# Patient Record
Sex: Female | Born: 1995 | Race: Black or African American | Hispanic: No | Marital: Single | Smoking: Never smoker
Health system: Southern US, Community
[De-identification: ages and names within clinical notes are randomized; demographics above are authoritative.]

## PROBLEM LIST (undated history)

## (undated) DIAGNOSIS — Z789 Other specified health status: Secondary | ICD-10-CM

## (undated) HISTORY — PX: NO PAST SURGERIES: SHX2092

---

## 2018-07-11 ENCOUNTER — Encounter (HOSPITAL_COMMUNITY): Payer: Self-pay | Admitting: Emergency Medicine

## 2018-07-11 ENCOUNTER — Emergency Department (HOSPITAL_COMMUNITY)
Admission: EM | Admit: 2018-07-11 | Discharge: 2018-07-11 | Disposition: A | Discharge status: Home / Self Care | Payer: 59 | Attending: Emergency Medicine | Admitting: Emergency Medicine

## 2018-07-11 ENCOUNTER — Other Ambulatory Visit: Payer: Self-pay

## 2018-07-11 DIAGNOSIS — L509 Urticaria, unspecified: Secondary | ICD-10-CM | POA: Insufficient documentation

## 2018-07-11 NOTE — ED Provider Notes (Signed)
[facility] EMERGENCY DEPARTMENT Provider Note   CSN: [id number] Arrival date & time: 07/11/18  1817     History   Chief Complaint Chief Complaint  Patient presents with  . Allergic Reaction    HPI Kelly Cortez is a 22 y.o. female.  22 y.o female with no PMH presents to the ED with a chief complaint of hives x 2 days. Patient reports she first noted the hives around her arms, torso and face.She describes them as pruritic.  She has taken benadryl and the hives and itching has improved. She denies any anaphalaxis, shortness of breath or history of asthma.      History reviewed. No pertinent past medical history.  There are no active problems to display for this patient.   History reviewed. No pertinent surgical history.   OB History   None      Home Medications    Prior to Admission medications   Not on File    Family History No family history on file.  Social History Social History   Tobacco Use  . Smoking status: Never Smoker  . Smokeless tobacco: Never Used  Substance Use Topics  . Alcohol use: Not Currently  . Drug use: Not Currently     Allergies   Patient has no allergy information on record.   Review of Systems Review of Systems  Skin: Positive for rash.  All other systems reviewed and are negative.    Physical Exam Updated Vital Signs BP 122/81 (BP Location: Right Arm)   Pulse 83   Temp 98.8 F (37.1 C) (Oral)   Resp 14   Ht 5\' 2"  (1.575 m)   Wt 66.2 kg   LMP 06/17/2018   SpO2 100%   BMI 26.70 kg/m   Physical Exam  Constitutional: She is oriented to person, place, and time. She appears well-developed and well-nourished.  HENT:  Head: Normocephalic and atraumatic.  Neck: Normal range of motion. Neck supple.  Cardiovascular: Normal heart sounds.  Pulmonary/Chest: Effort normal.  Abdominal: Soft. There is no tenderness.  Musculoskeletal: She exhibits no tenderness.  Neurological: She is alert and  oriented to person, place, and time.  Skin: Skin is warm and dry. Rash noted. Rash is urticarial.     Small amount of erythema wheals noted on left forearm and right forearm. Patient reports she took benadryl an hour ago.   Nursing note and vitals reviewed.    ED Treatments / Results  Labs (all labs ordered are listed, but only abnormal results are displayed) Labs Reviewed - No data to display  EKG None  Radiology No results found.  Procedures Procedures (including critical care time)  Medications Ordered in ED Medications - No data to display   Initial Impression / Assessment and Plan / ED Course  I have reviewed the triage vital signs and the nursing notes.  Pertinent labs & imaging results that were available during my care of the patient were reviewed by me and considered in my medical decision making (see chart for details).    Presents with rash which began on her left forearm, she reports the rash noted on her torso along her back.  Patient has taken Benadryl and states that the hives have improved significantly.  There is no mucosal involvement or anaphylaxis.  Patient reports she is been eating and drinking the same thing that she usually does ruffles, cookies. Patient reports improvement with Benadryl hives looked improved slight erythema noted. Patient denies any fever or mucosal  involvement. I have ice patient she may continue to take Benadryl but can also switch to Zyrtec that this is nondrowsy, she should also keep a food log of what she is eating in order to determine what is causing this rash.  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  Patient will be discharged with recommendations to obtain some Zyrtec's and follow-up with PCP as needed.  Return precautions provided.  Final Clinical Impressions(s) / ED Diagnoses   Final diagnoses:  Urticaria    ED Discharge Orders    None         Claude Manges, PA-C 07/11/18 1941    Gerhard Munch, MD 07/12/18 [phone]

## 2018-07-11 NOTE — Discharge Instructions (Addendum)
Please continue to take benadryl for your hives if it shows improvement. You may also take Zyrtec which is non-drowsy and will also help with your symptoms.

## 2018-07-11 NOTE — ED Triage Notes (Signed)
C/o allergic reaction with hives and itching all over since Friday.  No known allergies.  Pt eating potato chips during triage exam.  NAD.

## 2018-10-06 NOTE — L&D Delivery Note (Signed)
OB/GYN Faculty Practice Delivery Note  Kelly Cortez is a 23 y.o. G1P0 s/p SVD at [redacted]w[redacted]d. She was admitted for SOL.   ROM: 6h 47m with clear fluid GBS Status: Negative/-- (09/01 0858) Maximum Maternal Temperature: 98.2F  Labor Progress: . Patient presented to L&D for SOL. Initial SVE: 1/60/-2. Patient received Cytotec, Foley bulb and AROM. She then progressed to complete. Patient declined epidural.    Delivery Date/Time: 9/28 @ 0301 Delivery: Called to room and patient was complete and pushing. Head delivered in LOA position. Nuchal cord present and reduced after delivery. Shoulder and body delivered in usual fashion. Infant with spontaneous cry, placed on mother's abdomen, dried and stimulated. Cord clamped x 2 after 1-minute delay, and cut by FOB. Cord blood drawn. Placenta delivered spontaneously with gentle cord traction. Fundus firm with massage and Pitocin. Labia, perineum, vagina, and cervix inspected inspected with third degree perineal laceration (3a) which was repaired with 2-0 Vicryl in a standard fashion by Dr. Rosana Hoes. Ancef 2 g given after perineal laceration repair.  Baby Weight: pending   Placenta: Sent with patient Complications: None Lacerations: Third degree perineal laceration (3a) EBL: 175 mL Analgesia: None (Lidocaine and Fentanyl for repair)  Infant:  APGAR (1 MIN): 7  APGAR (5 MINS): 9 APGAR (10 MINS):    Barrington Ellison, MD [facility] Family Medicine Fellow, [facility] for [facility] [place] [facility] 07/04/2019, 3:57 AM

## 2018-12-23 LAB — OB RESULTS CONSOLE ABO/RH: RH Type: POSITIVE

## 2018-12-23 LAB — OB RESULTS CONSOLE RPR: RPR: NONREACTIVE

## 2018-12-23 LAB — OB RESULTS CONSOLE HIV ANTIBODY (ROUTINE TESTING): HIV: NONREACTIVE

## 2018-12-23 LAB — OB RESULTS CONSOLE HEPATITIS B SURFACE ANTIGEN: Hepatitis B Surface Ag: NEGATIVE

## 2018-12-23 LAB — OB RESULTS CONSOLE RUBELLA ANTIBODY, IGM: Rubella: IMMUNE

## 2019-01-30 ENCOUNTER — Other Ambulatory Visit: Payer: Self-pay

## 2019-01-30 ENCOUNTER — Encounter (HOSPITAL_COMMUNITY): Payer: Self-pay | Admitting: *Deleted

## 2019-01-30 ENCOUNTER — Inpatient Hospital Stay (HOSPITAL_COMMUNITY)
Admission: AD | Admit: 2019-01-30 | Discharge: 2019-01-30 | Disposition: A | Discharge status: Home / Self Care | Payer: 59 | Attending: Obstetrics & Gynecology | Admitting: Obstetrics & Gynecology

## 2019-01-30 DIAGNOSIS — Z3A18 18 weeks gestation of pregnancy: Secondary | ICD-10-CM | POA: Diagnosis not present

## 2019-01-30 DIAGNOSIS — R102 Pelvic and perineal pain: Secondary | ICD-10-CM | POA: Insufficient documentation

## 2019-01-30 DIAGNOSIS — Z3492 Encounter for supervision of normal pregnancy, unspecified, second trimester: Secondary | ICD-10-CM | POA: Diagnosis not present

## 2019-01-30 DIAGNOSIS — R1011 Right upper quadrant pain: Secondary | ICD-10-CM | POA: Diagnosis not present

## 2019-01-30 DIAGNOSIS — R109 Unspecified abdominal pain: Secondary | ICD-10-CM | POA: Diagnosis not present

## 2019-01-30 DIAGNOSIS — O9989 Other specified diseases and conditions complicating pregnancy, childbirth and the puerperium: Secondary | ICD-10-CM | POA: Insufficient documentation

## 2019-01-30 DIAGNOSIS — O26892 Other specified pregnancy related conditions, second trimester: Secondary | ICD-10-CM

## 2019-01-30 DIAGNOSIS — O26899 Other specified pregnancy related conditions, unspecified trimester: Secondary | ICD-10-CM

## 2019-01-30 HISTORY — DX: Other specified health status: Z78.9

## 2019-01-30 LAB — POCT PREGNANCY, URINE: Preg Test, Ur: POSITIVE — AB

## 2019-01-30 LAB — URINALYSIS, ROUTINE W REFLEX MICROSCOPIC
Bilirubin Urine: NEGATIVE
Glucose, UA: NEGATIVE mg/dL
Hgb urine dipstick: NEGATIVE
Ketones, ur: NEGATIVE mg/dL
Leukocytes,Ua: NEGATIVE
Nitrite: NEGATIVE
Protein, ur: NEGATIVE mg/dL
Specific Gravity, Urine: 1.021 (ref 1.005–1.030)
pH: 6 (ref 5.0–8.0)

## 2019-01-30 LAB — WET PREP, GENITAL
Clue Cells Wet Prep HPF POC: NONE SEEN
Sperm: NONE SEEN
Trich, Wet Prep: NONE SEEN
Yeast Wet Prep HPF POC: NONE SEEN

## 2019-01-30 NOTE — Discharge Instructions (Signed)

## 2019-01-30 NOTE — MAU Note (Addendum)
Kelly Cortez is a 23 y.o. at [redacted]w[redacted]d here in MAU reporting: upper and lower abdominal pain bilaterally. Intermittent Cramping and sharp in nature. NO PNC. Would like a list of providers to research. Onset of complaint: 4-5 days Pain score: 5/10 Denies LOF, VB, or abnormal discharge. Vitals:   01/30/19 1347  BP: 108/62  Pulse: 84  Resp: 17  Temp: 97.8 F (36.6 C)  SpO2: 99%     FHT: 145 via doppler

## 2019-01-30 NOTE — MAU Provider Note (Signed)
History     CSN: [id number]  Arrival date and time: 01/30/19 1311   First Provider Initiated Contact with Patient 01/30/19 1352      Chief Complaint  Patient presents with  . Abdominal Pain   HPI Kelly Cortez is a 23 y.o. G1P0 at [redacted]w[redacted]d who presents to MAU with chief complaint of RUQ pain and suprapubic cramping. These are new problems, onset 4-5 days ago. She rates her pain as 6/10, it waxes and wanes. Her pain does not radiate and she denies aggravating or alleviating factors. She has not taken medication or tried other treatments for this pain. Patient states she became worried when she realized her pain had been present for almost one week and decided to seek care. She denies pain upon arrival and throughout her evaluation in MAU. She declines pain medicine.  Patient's history includes a new ob appointment in [place] before she moved to [place] last month. She states her appointment included an ultrasound and she was told everything was great except she was diagnosed with Bacterial Vaginosis.  She denies vaginal bleeding, leaking of fluid, decreased fetal movement, fever, falls, or recent illness.  She denies nausea/vomiting, urinary complaints, history of UTIs or kidney stones.  She lives with her boyfriend/FOB. She denies SI, HI or IPV.   OB History    Gravida  1   Para      Term      Preterm      AB      Living        SAB      TAB      Ectopic      Multiple      Live Births              Past Medical History:  Diagnosis Date  . Medical history non-contributory     Past Surgical History:  Procedure Laterality Date  . NO PAST SURGERIES      History reviewed. No pertinent family history.  Social History   Tobacco Use  . Smoking status: Never Smoker  . Smokeless tobacco: Never Used  Substance Use Topics  . Alcohol use: Not Currently  . Drug use: Not Currently    Allergies: No Known Allergies  No medications prior to admission.     Review of Systems  Constitutional: Negative for chills, fatigue and fever.  Respiratory: Negative for shortness of breath.   Gastrointestinal: Positive for abdominal pain.  Genitourinary: Negative for difficulty urinating, dysuria, flank pain, pelvic pain, vaginal bleeding, vaginal discharge and vaginal pain.  Musculoskeletal: Negative for back pain.  Neurological: Negative for headaches.  All other systems reviewed and are negative.  Physical Exam   Blood pressure 108/62, pulse 84, temperature 97.8 F (36.6 C), temperature source Oral, resp. rate 17, last menstrual period 06/17/2018, SpO2 99 %.  Physical Exam  Nursing note and vitals reviewed. Constitutional: She is oriented to person, place, and time. She appears well-developed and well-nourished.  Cardiovascular: Normal rate.  Respiratory: Effort normal.  GI: Soft. She exhibits no distension. There is no abdominal tenderness. There is no rebound and no guarding.  Musculoskeletal: Normal range of motion.  Neurological: She is alert and oriented to person, place, and time.  Skin: Skin is warm.  Psychiatric: She has a normal mood and affect. Her behavior is normal. Judgment and thought content normal.    MAU Course/MDM  Procedures  Patient Vitals for the past 24 hrs:  BP Temp Temp src Pulse Resp SpO2  01/30/19  1501 108/69 - - 72 17 99 %  01/30/19 1347 108/62 97.8 F (36.6 C) Oral 84 17 99 %    Results for orders placed or performed during the hospital encounter of 01/30/19 (from the past 24 hour(s))  Pregnancy, urine POC     Status: Abnormal   Collection Time: 01/30/19  1:36 PM  Result Value Ref Range   Preg Test, Ur POSITIVE (A) NEGATIVE  Urinalysis, Routine w reflex microscopic     Status: None   Collection Time: 01/30/19  2:05 PM  Result Value Ref Range   Color, Urine YELLOW YELLOW   APPearance CLEAR CLEAR   Specific Gravity, Urine 1.021 1.005 - 1.030   pH 6.0 5.0 - 8.0   Glucose, UA NEGATIVE NEGATIVE mg/dL    Hgb urine dipstick NEGATIVE NEGATIVE   Bilirubin Urine NEGATIVE NEGATIVE   Ketones, ur NEGATIVE NEGATIVE mg/dL   Protein, ur NEGATIVE NEGATIVE mg/dL   Nitrite NEGATIVE NEGATIVE   Leukocytes,Ua NEGATIVE NEGATIVE  Wet prep, genital     Status: Abnormal   Collection Time: 01/30/19  2:37 PM  Result Value Ref Range   Yeast Wet Prep HPF POC NONE SEEN NONE SEEN   Trich, Wet Prep NONE SEEN NONE SEEN   Clue Cells Wet Prep HPF POC NONE SEEN NONE SEEN   WBC, Wet Prep HPF POC RARE (A) NONE SEEN   Sperm NONE SEEN      Assessment and Plan  --23 y.o. G1P0 at 4347w3d  --FHT 145 by Doppler --No concerning findings on physical exam or today's labs --Discharge home in stable condition  F/U: Patient to establish OB care at her earliest convenience. Given list of Providers in [place]  Maralyn Witherell C [place], [place] 01/30/2019, 3:20 PM

## 2019-01-31 LAB — GC/CHLAMYDIA PROBE AMP (~~LOC~~) NOT AT ARMC
Chlamydia: NEGATIVE
Neisseria Gonorrhea: NEGATIVE

## 2019-03-09 ENCOUNTER — Other Ambulatory Visit: Payer: Self-pay

## 2019-03-09 ENCOUNTER — Inpatient Hospital Stay (HOSPITAL_COMMUNITY)
Admission: AD | Admit: 2019-03-09 | Discharge: 2019-03-09 | Disposition: A | Discharge status: Home / Self Care | Payer: Medicaid Other | Attending: Obstetrics and Gynecology | Admitting: Obstetrics and Gynecology

## 2019-03-09 DIAGNOSIS — O26892 Other specified pregnancy related conditions, second trimester: Secondary | ICD-10-CM | POA: Diagnosis present

## 2019-03-09 DIAGNOSIS — Z3A23 23 weeks gestation of pregnancy: Secondary | ICD-10-CM | POA: Diagnosis not present

## 2019-03-09 NOTE — MAU Note (Signed)
Pt states she was seen here for abdm pain approx 70mo ago & was told to come back if she couldn't find an OB/GYN.  States +FM, denies LOF, pain, or vag bleeding.

## 2019-03-09 NOTE — Discharge Instructions (Signed)

## 2019-03-09 NOTE — MAU Provider Note (Signed)
  History     CSN: [id number]  Arrival date and time: 03/09/19 1433   First Provider Initiated Contact with Patient 03/09/19 1516      Chief Complaint  Patient presents with  . Follow-up   Kelly Cortez is a 23 y.o. G1P0 at [redacted]w[redacted]d who presents today for follow up. She was seen in at the beginning of April, and was told if she can't get any appointment anywhere she could return here. She has an appointment for a new OB visit on 03/28/2019, but she was concerned that was too far away. She was getting prenatal care in [place] prior to coming here. She states that she has already had several US done at her prior OB office, but she does not have records. Her prior OB office told her they could fax records when needed. She denies any pain, bleeding or LOF. She reports normal fetal movement.    OB History    Gravida  1   Para      Term      Preterm      AB      Living        SAB      TAB      Ectopic      Multiple      Live Births              Past Medical History:  Diagnosis Date  . Medical history non-contributory     Past Surgical History:  Procedure Laterality Date  . NO PAST SURGERIES      No family history on file.  Social History   Tobacco Use  . Smoking status: Never Smoker  . Smokeless tobacco: Never Used  Substance Use Topics  . Alcohol use: Not Currently  . Drug use: Not Currently    Allergies: No Known Allergies  No medications prior to admission.    Review of Systems  All other systems reviewed and are negative.  Physical Exam   Blood pressure 110/69, pulse 88, temperature 98.5 F (36.9 C), temperature source Oral, resp. rate 16, height 5\' 1"  (1.549 m), weight 68.9 kg, last menstrual period 06/17/2018, SpO2 98 %.  Physical Exam  Nursing note and vitals reviewed. Constitutional: She is oriented to person, place, and time. She appears well-developed and well-nourished. No distress.  HENT:  Head: Normocephalic.  Cardiovascular: Normal  rate.  Respiratory: Effort normal.  Musculoskeletal: Normal range of motion.  Neurological: She is alert and oriented to person, place, and time.  Psychiatric: She has a normal mood and affect.   FHT 165 with doppler  MAU Course  Procedures  MDM   Assessment and Plan   1. [redacted] weeks gestation of pregnancy    DC home Comfort measures reviewed  2nd Trimester precautions  PTL precautions  Fetal kick counts RX: none  Return to MAU as needed FU with OB as planned Patient advised to come to [facility] appointment fasting so that they can do her GDM test on the same day.   Follow-up Information    [facility] Follow up.   Specialty:  Obstetrics and Gynecology Why:  Have your prior doctors office send your records to this office  Contact information: [address] [place] [address] [phone]          Thressa Sheller DNP, CNM  03/09/19  3:30 PM

## 2019-03-28 ENCOUNTER — Encounter: Payer: Self-pay | Admitting: Family Medicine

## 2019-03-28 ENCOUNTER — Ambulatory Visit (INDEPENDENT_AMBULATORY_CARE_PROVIDER_SITE_OTHER): Payer: 59 | Admitting: Family Medicine

## 2019-03-28 ENCOUNTER — Telehealth: Payer: Self-pay

## 2019-03-28 ENCOUNTER — Other Ambulatory Visit: Payer: Self-pay

## 2019-03-28 DIAGNOSIS — Z3402 Encounter for supervision of normal first pregnancy, second trimester: Secondary | ICD-10-CM | POA: Diagnosis not present

## 2019-03-28 DIAGNOSIS — Z3A26 26 weeks gestation of pregnancy: Secondary | ICD-10-CM

## 2019-03-28 DIAGNOSIS — Z3400 Encounter for supervision of normal first pregnancy, unspecified trimester: Secondary | ICD-10-CM

## 2019-03-28 MED ORDER — AMBULATORY NON FORMULARY MEDICATION: 1.0000 | 0 refills | Status: AC

## 2019-03-28 NOTE — Telephone Encounter (Signed)
Mary from [place] called stating pt will return tomorrow 03/29/19 to do 2 hr GTT.  chiquita l wilson, CMA

## 2019-03-28 NOTE — Progress Notes (Signed)
  Subjective:  Kelly Cortez is a G1P0 [redacted]w[redacted]d being seen today for her first obstetrical visit.  She had received care in [place], but moved here in March and hasn't had care since. No complications thus far in the pregnancy. FOB involved. Patient does intend to breast feed. Pregnancy history fully reviewed.  Patient reports no complaints.  BP 105/60   Pulse 89   Wt 154 lb 0.6 oz (69.9 kg)   LMP 06/17/2018   BMI 29.11 kg/m   HISTORY: OB History  Gravida Para Term Preterm AB Living  1            SAB TAB Ectopic Multiple Live Births               # Outcome Date GA Lbr Len/2nd Weight Sex Delivery Anes PTL Lv  1 Current             Past Medical History:  Diagnosis Date  . Medical history non-contributory     Past Surgical History:  Procedure Laterality Date  . NO PAST SURGERIES      No family history on file.   Exam  BP 105/60   Pulse 89   Wt 154 lb 0.6 oz (69.9 kg)   LMP 06/17/2018   BMI 29.11 kg/m   CONSTITUTIONAL: Well-developed, well-nourished female in no acute distress.  HENT:  Normocephalic, atraumatic, External right and left ear normal. Oropharynx is clear and moist EYES: Conjunctivae and EOM are normal. Pupils are equal, round, and reactive to light. No scleral icterus.  NECK: Normal range of motion, supple, no masses.  Normal thyroid.  CARDIOVASCULAR: Normal heart rate noted, regular rhythm RESPIRATORY: Clear to auscultation bilaterally. Effort and breath sounds normal, no problems with respiration noted. BREASTS: Symmetric in size. No masses, skin changes, nipple drainage, or lymphadenopathy. ABDOMEN: Soft, normal bowel sounds, no distention noted.  No tenderness, rebound or guarding.  PELVIC: Normal appearing external genitalia; normal appearing vaginal mucosa and cervix. No abnormal discharge noted. Normal uterine size, no other palpable masses, no uterine or adnexal tenderness. MUSCULOSKELETAL: Normal range of motion. No tenderness.  No cyanosis,  clubbing, or edema.  2+ distal pulses. SKIN: Skin is warm and dry. No rash noted. Not diaphoretic. No erythema. No pallor. NEUROLOGIC: Alert and oriented to person, place, and time. Normal reflexes, muscle tone coordination. No cranial nerve deficit noted. PSYCHIATRIC: Normal mood and affect. Normal behavior. Normal judgment and thought content.    Assessment:    Pregnancy: G1P0 Patient Active Problem List   Diagnosis Date Noted  . Supervision of normal first pregnancy, antepartum 03/28/2019      Plan:   1. Supervision of normal first pregnancy, antepartum FHT and FH normal.  Discussed nature of practice. Will get fetal survey and request records. - CBC - Glucose Tolerance, 2 Hours w/1 Hour - HIV Antibody (routine testing w rflx) - RPR - POC Urinalysis Dipstick OB     Problem list reviewed and updated. 75% of 30 min visit spent on counseling and coordination of care.    Truett Mainland 03/28/2019

## 2019-03-31 ENCOUNTER — Telehealth: Payer: Self-pay

## 2019-03-31 MED ORDER — VITAFOL-OB+DHA 65-1 & 250 MG PO MISC: 1.0000 | Freq: Every day | ORAL | 12 refills | Status: AC

## 2019-03-31 NOTE — Telephone Encounter (Signed)
Patient request refill of vitfol prenatals [facility] [address]. Kathrene Alu RN

## 2019-04-01 LAB — CBC
Hematocrit: 32.8 % — ABNORMAL LOW (ref 34.0–46.6)
Hemoglobin: 10.8 g/dL — ABNORMAL LOW (ref 11.1–15.9)
MCH: 28.3 pg (ref 26.6–33.0)
MCHC: 32.9 g/dL (ref 31.5–35.7)
MCV: 86 fL (ref 79–97)
Platelets: 171 10*3/uL (ref 150–450)
RBC: 3.81 x10E6/uL (ref 3.77–5.28)
RDW: 13.7 % (ref 11.7–15.4)
WBC: 9.9 10*3/uL (ref 3.4–10.8)

## 2019-04-01 LAB — HIV ANTIBODY (ROUTINE TESTING W REFLEX): HIV Screen 4th Generation wRfx: NONREACTIVE

## 2019-04-01 LAB — GLUCOSE TOLERANCE, 2 HOURS W/ 1HR
Glucose, 1 hour: 56 mg/dL — ABNORMAL LOW (ref 65–179)
Glucose, 2 hour: 82 mg/dL (ref 65–152)
Glucose, Fasting: 68 mg/dL (ref 65–91)

## 2019-04-01 LAB — RPR: RPR Ser Ql: NONREACTIVE

## 2019-04-04 ENCOUNTER — Other Ambulatory Visit: Payer: Self-pay

## 2019-04-04 MED ORDER — FERROUS GLUCONATE 324 (38 FE) MG PO TABS: 324.0000 mg | ORAL_TABLET | Freq: Two times a day (BID) | ORAL | 3 refills | Status: DC

## 2019-04-04 NOTE — Telephone Encounter (Signed)
Patient made aware of low iron levels. Would like for Rx to be sent to different pharmacy. Changed in the chart. Kathrene Alu RN

## 2019-04-04 NOTE — Addendum Note (Signed)
Addended by: Truett Mainland on: 04/04/2019 09:09 AM   Modules accepted: Orders

## 2019-04-06 ENCOUNTER — Telehealth: Payer: Self-pay

## 2019-04-06 NOTE — Telephone Encounter (Signed)
Error

## 2019-04-07 ENCOUNTER — Telehealth: Payer: Self-pay

## 2019-04-07 NOTE — Telephone Encounter (Signed)
Called and left message on doctors line at [facility] making them aware dr. Nehemiah Settle approves that she take the vitifol prenatal and an addition iron pill due to anemia in pregnancy. Kathrene Alu RN

## 2019-04-12 ENCOUNTER — Encounter: Payer: Self-pay | Admitting: Advanced Practice Midwife

## 2019-04-12 ENCOUNTER — Other Ambulatory Visit: Payer: Self-pay

## 2019-04-12 ENCOUNTER — Ambulatory Visit (INDEPENDENT_AMBULATORY_CARE_PROVIDER_SITE_OTHER): Payer: 59 | Admitting: Advanced Practice Midwife

## 2019-04-12 DIAGNOSIS — Z3400 Encounter for supervision of normal first pregnancy, unspecified trimester: Secondary | ICD-10-CM

## 2019-04-12 DIAGNOSIS — Z3A28 28 weeks gestation of pregnancy: Secondary | ICD-10-CM

## 2019-04-12 DIAGNOSIS — Z3403 Encounter for supervision of normal first pregnancy, third trimester: Secondary | ICD-10-CM

## 2019-04-12 NOTE — Progress Notes (Signed)
   [place] VIRTUAL VIDEO VISIT ENCOUNTER NOTE  Provider location: [facility] [employer] [facility]   I connected with Erskine Emery on 04/12/19 at 10:30 AM EDT by Telephone Encounter at home (patient unable to connect via video) and verified that I am speaking with the correct person using two identifiers.   I discussed the limitations, risks, security and privacy concerns of performing an evaluation and management service virtually and the availability of in person appointments. I also discussed with the patient that there may be a patient responsible charge related to this service. The patient expressed understanding and agreed to proceed. Subjective:  Ellene Bloodsaw is a 23 y.o. G1P0 at [redacted]w[redacted]d being seen today for ongoing prenatal care.  She is currently monitored for the following issues for this low-risk pregnancy and has Supervision of normal first pregnancy, antepartum on their problem list.  Patient reports no complaints.   .  .  Movement: Present. Denies any leaking of fluid.   The following portions of the patient's history were reviewed and updated as appropriate: allergies, current medications, past family history, past medical history, past social history, past surgical history and problem list.   Objective:   Vitals:   04/12/19 1036  BP: 120/79  Pulse: 97    Fetal Status:     Movement: Present     General:  Alert, oriented and cooperative. Patient is in no acute distress.  Respiratory: Normal respiratory effort, no problems with respiration noted  Mental Status: Normal mood and affect. Normal behavior. Normal judgment and thought content.  Rest of physical exam deferred due to type of encounter  Imaging: No results found.  Assessment and Plan:  Pregnancy: G1P0 at [redacted]w[redacted]d 1. Supervision of normal first pregnancy, antepartum      Has appt for anatomy US tomorrow. Reviewed location and how to get there      Reviewed that her  Glucose Tolerance test was normal       Reviewed signs to watch for and how to get to MAU  Preterm labor symptoms and general obstetric precautions including but not limited to vaginal bleeding, contractions, leaking of fluid and fetal movement were reviewed in detail with the patient. I discussed the assessment and treatment plan with the patient. The patient was provided an opportunity to ask questions and all were answered. The patient agreed with the plan and demonstrated an understanding of the instructions. The patient was advised to call back or seek an in-person office evaluation/go to MAU at [facility] for any urgent or concerning symptoms. Please refer to After Visit Summary for other counseling recommendations.   I provided 10 minutes of telephone time during this encounter.  RTO 2 weeks  Future Appointments  Date Time Provider [place]  04/13/2019 12:45 PM WH-MFC [place] 2 WH-MFCUS MFC-US    Shadell Brenn, [place] for [employer], [place]

## 2019-04-12 NOTE — Progress Notes (Signed)
120/79 P 97

## 2019-04-13 ENCOUNTER — Other Ambulatory Visit: Payer: Self-pay

## 2019-04-13 ENCOUNTER — Ambulatory Visit (HOSPITAL_COMMUNITY)
Admission: RE | Admit: 2019-04-13 | Discharge: 2019-04-13 | Disposition: A | Discharge status: Home / Self Care | Payer: 59 | Source: Ambulatory Visit | Attending: Family Medicine | Admitting: Family Medicine

## 2019-04-13 DIAGNOSIS — Z3A28 28 weeks gestation of pregnancy: Secondary | ICD-10-CM

## 2019-04-13 DIAGNOSIS — O0933 Supervision of pregnancy with insufficient antenatal care, third trimester: Secondary | ICD-10-CM

## 2019-04-13 DIAGNOSIS — Z3400 Encounter for supervision of normal first pregnancy, unspecified trimester: Secondary | ICD-10-CM

## 2019-04-13 DIAGNOSIS — Z363 Encounter for antenatal screening for malformations: Secondary | ICD-10-CM

## 2019-04-13 DIAGNOSIS — Z3687 Encounter for antenatal screening for uncertain dates: Secondary | ICD-10-CM

## 2019-04-14 ENCOUNTER — Other Ambulatory Visit (HOSPITAL_COMMUNITY): Payer: Self-pay | Admitting: *Deleted

## 2019-04-14 DIAGNOSIS — Z362 Encounter for other antenatal screening follow-up: Secondary | ICD-10-CM

## 2019-04-28 ENCOUNTER — Other Ambulatory Visit: Payer: Self-pay

## 2019-04-28 ENCOUNTER — Encounter: Payer: 59 | Admitting: Family Medicine

## 2019-05-02 ENCOUNTER — Ambulatory Visit (INDEPENDENT_AMBULATORY_CARE_PROVIDER_SITE_OTHER): Payer: 59 | Admitting: Family Medicine

## 2019-05-02 VITALS — BP 115/74 | HR 118

## 2019-05-02 DIAGNOSIS — Z3A31 31 weeks gestation of pregnancy: Secondary | ICD-10-CM

## 2019-05-02 DIAGNOSIS — Z3403 Encounter for supervision of normal first pregnancy, third trimester: Secondary | ICD-10-CM

## 2019-05-02 DIAGNOSIS — Z3400 Encounter for supervision of normal first pregnancy, unspecified trimester: Secondary | ICD-10-CM

## 2019-05-02 NOTE — Progress Notes (Signed)
[place] VIRTUAL VIDEO VISIT ENCOUNTER NOTE  Provider location: [facility] [place] at [facility]   I connected with Kelly Cortez on 05/02/19 at 11:00 AM EDT by WebEx Video Encounter at home and verified that I am speaking with the correct person using two identifiers.   I discussed the limitations, risks, security and privacy concerns of performing an evaluation and management service virtually and the availability of in person appointments. I also discussed with the patient that there may be a patient responsible charge related to this service. The patient expressed understanding and agreed to proceed. Subjective:  Kelly Cortez is a 23 y.o. G1P0 at [redacted]w[redacted]d being seen today for ongoing prenatal care.  She is currently monitored for the following issues for this low-risk pregnancy and has Supervision of normal first pregnancy, antepartum on their problem list.  Patient reports no complaints.  Contractions: Not present. Vag. Bleeding: None.  Movement: Present. Denies any leaking of fluid.   The following portions of the patient's history were reviewed and updated as appropriate: allergies, current medications, past family history, past medical history, past social history, past surgical history and problem list.   Objective:   Vitals:   05/02/19 0949  BP: 115/74  Pulse: (!) 118    Fetal Status:     Movement: Present     General:  Alert, oriented and cooperative. Patient is in no acute distress.  Respiratory: Normal respiratory effort, no problems with respiration noted  Mental Status: Normal mood and affect. Normal behavior. Normal judgment and thought content.  Rest of physical exam deferred due to type of encounter  Imaging: [place] Mfm Ob Comp + 14 Wk  Result Date: 04/13/2019 ----------------------------------------------------------------------  OBSTETRICS REPORT                       (Signed Final 04/13/2019 05:02 pm)  ---------------------------------------------------------------------- Patient Info  ID #:       [id number]                          D.O.B.:  Sep 05, 1996 (23 yrs)  Name:       Kelly Cortez                  Visit Date: 04/13/2019 02:12 pm ---------------------------------------------------------------------- Performed By  Performed By:     Valda Favia,         Ref. Address:     [place]  Attending:        Sander Nephew      [facility]                                       [facility]  [facility] ---------------------------------------------------------------------- Orders   #  Description                          Code         Ordered By   1  US MFM OB COMP + 14 WK               [id number]     JACOB STINSON  ----------------------------------------------------------------------   #  Order #                    Accession #                 Episode #   1  [id number]                  [id number]                  [id number]  ---------------------------------------------------------------------- Indications   Antenatal screening for malformations          Z36.3   Encounter for uncertain dates                  Z36.87   Insufficient Prenatal Care                     O09.30   [redacted] weeks gestation of pregnancy                Z3A.28  ---------------------------------------------------------------------- Fetal Evaluation  Num Of Fetuses:         1  Fetal Heart Rate(bpm):  142  Cardiac Activity:       Observed  Presentation:           Cephalic  Placenta:               Posterior  P. Cord Insertion:      Visualized  Amniotic Fluid  AFI FV:      Within normal limits  AFI Sum(cm)     %Tile       Largest Pocket(cm)  11.34           22          3.18  RUQ(cm)       RLQ(cm)       LUQ(cm)        LLQ(cm)  2.91          2.81          2.44           3.18  ---------------------------------------------------------------------- Biometry  BPD:      70.5  mm     G. Age:  28w 2d         39  %    CI:        73.25   %    70 - 86                                                          FL/HC:      20.4   %    18.8 - 20.6  HC:      261.8  mm     G. Age:  28w 3d         24  %  HC/AC:      1.09        1.05 - 1.21  AC:      239.3  mm     G. Age:  28w 2d         40  %    FL/BPD:     75.7   %    71 - 87  FL:       53.4  mm     G. Age:  28w 2d         36  %    FL/AC:      22.3   %    20 - 24  HUM:      47.8  mm     G. Age:  28w 0d         41  %  Est. FW:    1204  gm    2 lb 10 oz      37  % ---------------------------------------------------------------------- OB History  Gravidity:    1         Term:   0        Prem:   0        SAB:   0  TOP:          0       Ectopic:  0        Living: 0 ---------------------------------------------------------------------- Gestational Age  U/S Today:     28w 2d                                        EDD:   07/04/19  Best:          28w 2d     Det. By:  U/S (04/13/19)           EDD:   07/04/19 ---------------------------------------------------------------------- Anatomy  Cranium:               Appears normal         LVOT:                   Appears normal  Cavum:                 Appears normal         Aortic Arch:            Appears normal  Ventricles:            Appears normal         Diaphragm:              Appears normal  Choroid Plexus:        Appears normal         Stomach:                Appears normal, left                                                                        sided  Cerebellum:            Appears normal         Abdomen:  Appears normal  Posterior Fossa:       Appears normal         Abdominal Wall:         Appears nml (cord                                                                        insert, abd wall)  Nuchal Fold:           Not applicable (>20    Cord Vessels:           Appears normal ([redacted]                          wks GA)                                        vessel cord)  Face:                  Appears normal         Kidneys:                Appear normal                         (orbits and profile)  Lips:                  Appears normal         Bladder:                Appears normal  Thoracic:              Appears normal         Spine:                  Appears normal  Heart:                 Appears normal         Upper Extremities:      Appears normal                         (4CH, axis, and                         situs)  RVOT:                  Appears normal         Lower Extremities:      Appears normal  Other:  Hands and feet visualized. 3VV and 3VT visualized. ---------------------------------------------------------------------- Cervix Uterus Adnexa  Cervix  Normal appearance by transabdominal scan.  Uterus  No abnormality visualized.  Left Ovary  Within normal limits.  Right Ovary  Within normal limits.  Cul De Sac  No free fluid seen.  Adnexa  No abnormality visualized. ---------------------------------------------------------------------- Impression  Uncertain LMP- The pregnancy is dated by today's  examination.  Normal interval growth.  No ultrasonic evidence of structural  fetal anomalies. ---------------------------------------------------------------------- Recommendations  Follow up growth in 4-6 weeks to trend growth and  confirm  dating. ----------------------------------------------------------------------               Lin Landsmanorenthian Booker, MD Electronically Signed Final Report   04/13/2019 05:02 pm ----------------------------------------------------------------------   Assessment and Plan:  Pregnancy: G1P0 at 4076w5d  1. Supervision of normal first pregnancy, antepartum Doing well, no concerns. Occasional BH ctxs. Good fetal movement. F/u in office for 36 week cultures.   Preterm labor symptoms and general obstetric precautions including but not limited to vaginal bleeding, contractions,  leaking of fluid and fetal movement were reviewed in detail with the patient. I discussed the assessment and treatment plan with the patient. The patient was provided an opportunity to ask questions and all were answered. The patient agreed with the plan and demonstrated an understanding of the instructions. The patient was advised to call back or seek an in-person office evaluation/[facility] for any urgent or concerning symptoms. Please refer to After Visit Summary for other counseling recommendations.   I provided 11 minutes of face-to-face time during this encounter.  Return in about 4 weeks (around 05/30/2019) for OB f/u, In Office.  Future Appointments  Date Time Provider Department Center  05/02/2019 11:00 AM Levie HeritageStinson, Jacob J, DO [facility] None  05/09/2019  7:45 AM [facility] US 2 [facility]US    Levie HeritageJacob J Stinson, DO [facility], [facility]

## 2019-05-02 NOTE — Progress Notes (Signed)
Patient agrees to webex visit today. Kathrene Alu RN

## 2019-05-09 ENCOUNTER — Ambulatory Visit (HOSPITAL_COMMUNITY)
Admission: RE | Admit: 2019-05-09 | Discharge: 2019-05-09 | Disposition: A | Discharge status: Home / Self Care | Payer: 59 | Source: Ambulatory Visit | Attending: Obstetrics and Gynecology | Admitting: Obstetrics and Gynecology

## 2019-05-09 ENCOUNTER — Other Ambulatory Visit: Payer: Self-pay

## 2019-05-09 DIAGNOSIS — Z3A32 32 weeks gestation of pregnancy: Secondary | ICD-10-CM

## 2019-05-09 DIAGNOSIS — Z362 Encounter for other antenatal screening follow-up: Secondary | ICD-10-CM | POA: Diagnosis present

## 2019-05-18 ENCOUNTER — Other Ambulatory Visit: Payer: Self-pay

## 2019-05-18 ENCOUNTER — Emergency Department (HOSPITAL_BASED_OUTPATIENT_CLINIC_OR_DEPARTMENT_OTHER)
Admission: EM | Admit: 2019-05-18 | Discharge: 2019-05-18 | Disposition: A | Discharge status: Home / Self Care | Payer: 59 | Attending: Emergency Medicine | Admitting: Emergency Medicine

## 2019-05-18 ENCOUNTER — Encounter (HOSPITAL_BASED_OUTPATIENT_CLINIC_OR_DEPARTMENT_OTHER): Payer: Self-pay | Admitting: Emergency Medicine

## 2019-05-18 DIAGNOSIS — R1030 Lower abdominal pain, unspecified: Secondary | ICD-10-CM | POA: Diagnosis not present

## 2019-05-18 DIAGNOSIS — O9989 Other specified diseases and conditions complicating pregnancy, childbirth and the puerperium: Secondary | ICD-10-CM | POA: Diagnosis not present

## 2019-05-18 DIAGNOSIS — Z79899 Other long term (current) drug therapy: Secondary | ICD-10-CM | POA: Insufficient documentation

## 2019-05-18 DIAGNOSIS — O26893 Other specified pregnancy related conditions, third trimester: Secondary | ICD-10-CM

## 2019-05-18 DIAGNOSIS — Z3A33 33 weeks gestation of pregnancy: Secondary | ICD-10-CM | POA: Insufficient documentation

## 2019-05-18 DIAGNOSIS — R109 Unspecified abdominal pain: Secondary | ICD-10-CM

## 2019-05-18 LAB — URINALYSIS, ROUTINE W REFLEX MICROSCOPIC
Bilirubin Urine: NEGATIVE
Glucose, UA: NEGATIVE mg/dL
Hgb urine dipstick: NEGATIVE
Ketones, ur: NEGATIVE mg/dL
Leukocytes,Ua: NEGATIVE
Nitrite: NEGATIVE
Protein, ur: NEGATIVE mg/dL
Specific Gravity, Urine: 1.01 (ref 1.005–1.030)
pH: 6.5 (ref 5.0–8.0)

## 2019-05-18 MED ORDER — SODIUM CHLORIDE 0.9 % IV BOLUS (SEPSIS)
1000.0000 mL | Freq: Once | INTRAVENOUS | Status: AC
  Administered 2019-05-18: 1000 mL via INTRAVENOUS

## 2019-05-18 NOTE — Discharge Instructions (Addendum)
You may take Tylenol 1000 mg every 6 hours as needed for pain.  I recommend you increase your water intake at home and try to drink at least 60 ounces of water every day.  Please follow-up with your OB/GYN as scheduled.

## 2019-05-18 NOTE — Progress Notes (Signed)
Category I FHR tracing. Pt. Cervix closed/posterior per ED provider. No CTX noted on monitor/palpated by ED RN. Dr. Nehemiah Settle obstetrically clearing patient at this time.

## 2019-05-18 NOTE — ED Notes (Signed)
Pt reports no pains since being placed on monitor. Abd soft, no contractions felt on palpation.

## 2019-05-18 NOTE — ED Triage Notes (Signed)
Sharp lower abd pain intermittent starting at approx 0045. G1P0. OB is Dr. Nehemiah Settle. No rush of fluids.

## 2019-05-18 NOTE — ED Notes (Signed)
Pt's OB made aware of presence in department. Requesting urine sample.

## 2019-05-18 NOTE — ED Notes (Signed)
Pt. At [facility] emergency department c/o sharp intermittent abd. Pain starting ~0045. Pt. Uncomplicated prenatal hx. Pt. Denies bleeding or LOF. Vital signs WNL. Dr. Nehemiah Settle made aware of patient. Plan for NST and urine at this time.   RN contacted ED RN to palpate abd for ctx. None noted on toco.

## 2019-05-18 NOTE — ED Notes (Signed)
EDP at bedside to assess cervix.

## 2019-05-18 NOTE — ED Provider Notes (Signed)
TIME SEEN: 1:21 AM  CHIEF COMPLAINT: Abdominal pain  HPI: Patient is a 23 year old G1, P0 who is currently 33 weeks and 2 days pregnant who presents to the emergency department with sharp lower abdominal pain that changes with movement.  She is not sure if she is feeling contractions as this is her first pregnancy.  No back pain.  No fevers, vomiting, diarrhea, dysuria, hematuria, vaginal bleeding or discharge.  She has not been leaking any fluid.  She states she is feeling her baby moving normally.  Due date is September 28.  OB/GYN is Dr. Nehemiah Settle.   ROS: See HPI Constitutional: no fever  Eyes: no drainage  ENT: no runny nose   Cardiovascular:  no chest pain  Resp: no SOB  GI: no vomiting GU: no dysuria Integumentary: no rash  Allergy: no hives  Musculoskeletal: no leg swelling  Neurological: no slurred speech ROS otherwise negative  PAST MEDICAL HISTORY/PAST SURGICAL HISTORY:  Past Medical History:  Diagnosis Date  . Medical history non-contributory     MEDICATIONS:  Prior to Admission medications   Medication Sig Start Date End Date Taking? Authorizing Provider  AMBULATORY NON FORMULARY MEDICATION 1 Device by Other route once a week. Blood pressure cuff Monitored regularly at home  ICD Z34.90 Patient not taking: Reported on 04/12/2019 03/28/19   Truett Mainland, DO  ferrous gluconate (FERGON) 324 MG tablet Take 1 tablet (324 mg total) by mouth 2 (two) times daily with a meal. Patient not taking: Reported on 04/12/2019 04/04/19   Truett Mainland, DO  Prenat-Fe Poly-Methfol-FA-DHA (VITAFOL ULTRA) 29-0.6-0.4-200 MG CAPS Take 1 tablet by mouth daily. 01/17/19   [provider]  Prenatal MV-Min-Fe Fum-FA-DHA (VITAFOL-OB+DHA) 65-1 & 250 MG MISC Take 1 capsule by mouth daily. 03/31/19   Seabron Spates, CNM  Prenatal Vit-Fe Fumarate-FA (PRENATAL VITAMINS PO) Take by mouth.    [provider]    ALLERGIES:  No Known Allergies  SOCIAL HISTORY:  Social History    Tobacco Use  . Smoking status: Never Smoker  . Smokeless tobacco: Never Used  Substance Use Topics  . Alcohol use: Not Currently    FAMILY HISTORY: History reviewed. No pertinent family history.  EXAM: BP 116/72 (BP Location: Left Arm)   Pulse 92   Temp 98 F (36.7 C)   Resp 16   LMP 06/17/2018   SpO2 99%  CONSTITUTIONAL: Alert and oriented and responds appropriately to questions. Well-appearing; well-nourished HEAD: Normocephalic EYES: Conjunctivae clear, pupils appear equal, EOMI ENT: normal nose; moist mucous membranes NECK: Supple, no meningismus, no nuchal rigidity, no LAD  CARD: RRR; S1 and S2 appreciated; no murmurs, no clicks, no rubs, no gallops RESP: Normal chest excursion without splinting or tachypnea; breath sounds clear and equal bilaterally; no wheezes, no rhonchi, no rales, no hypoxia or respiratory distress, speaking full sentences ABD/GI: Normal bowel sounds; gravid uterus; soft, non-tender, no rebound, no guarding, no peritoneal signs, no hepatosplenomegaly GU: Cervix is thick, closed and high.  No bleeding or abnormal discharge.  External genitalia appears normal. BACK:  The back appears normal and is non-tender to palpation, there is no CVA tenderness EXT: Normal ROM in all joints; non-tender to palpation; no edema; normal capillary refill; no cyanosis, no calf tenderness or swelling    SKIN: Normal color for age and race; warm; no rash NEURO: Moves all extremities equally PSYCH: The patient's mood and manner are appropriate. Grooming and personal hygiene are appropriate.  MEDICAL DECISION MAKING: Patient here with complaints of  abdominal pain in pregnancy.  Will give IV fluids.  She declines Tylenol at this time.  Abdominal exam benign.  Doubt appendicitis, cholecystitis, colitis, diverticulitis, bowel obstruction.  Her cervix is thick, closed and high.  No contractions seen on tocometry.  She has no signs of contractions on exam.  OB nurse monitoring  patient remotely.  Will obtain urinalysis.  ED PROGRESS: Patient has had normal fetal heart tones.  No contractions seen on tocometry.  Reports feeling better after fluids.  Urine shows no sign of infection, no ketones, no protein.  Blood pressure normal here.  Good fetal movement felt by patient.  Patient has been cleared by OB/GYN.  We will have her follow-up with her OB/GYN as an outpatient as scheduled.  Recommended increase fluid intake at home, Tylenol as needed.  Discussed return precautions.   At this time, I do not feel there is any life-threatening condition present. I have reviewed and discussed all results (EKG, imaging, lab, urine as appropriate) and exam findings with patient/family. I have reviewed nursing notes and appropriate previous records.  I feel the patient is safe to be discharged home without further emergent workup and can continue workup as an outpatient as needed. Discussed usual and customary return precautions. Patient/family verbalize understanding and are comfortable with this plan.  Outpatient follow-up has been provided as needed. All questions have been answered.      Takeysha Bonk, Layla MawKristen N, DO 05/18/19 [phone]

## 2019-06-02 ENCOUNTER — Encounter: Payer: 59 | Admitting: Family Medicine

## 2019-06-07 ENCOUNTER — Other Ambulatory Visit (HOSPITAL_COMMUNITY)
Admission: RE | Admit: 2019-06-07 | Discharge: 2019-06-07 | Disposition: A | Discharge status: Home / Self Care | Payer: 59 | Source: Ambulatory Visit | Attending: Obstetrics and Gynecology | Admitting: Obstetrics and Gynecology

## 2019-06-07 ENCOUNTER — Other Ambulatory Visit: Payer: Self-pay

## 2019-06-07 ENCOUNTER — Ambulatory Visit (INDEPENDENT_AMBULATORY_CARE_PROVIDER_SITE_OTHER): Payer: 59 | Admitting: Obstetrics and Gynecology

## 2019-06-07 VITALS — BP 121/71 | HR 87 | Wt 169.0 lb

## 2019-06-07 DIAGNOSIS — Z3400 Encounter for supervision of normal first pregnancy, unspecified trimester: Secondary | ICD-10-CM | POA: Insufficient documentation

## 2019-06-07 DIAGNOSIS — Z3A36 36 weeks gestation of pregnancy: Secondary | ICD-10-CM

## 2019-06-07 DIAGNOSIS — Z3403 Encounter for supervision of normal first pregnancy, third trimester: Secondary | ICD-10-CM

## 2019-06-07 LAB — OB RESULTS CONSOLE GBS: GBS: NEGATIVE

## 2019-06-07 NOTE — Patient Instructions (Signed)
  Cervical Ripening: May try one or both  Red Raspberry Leaf capsules:  two 300mg  or 400mg  tablets with each meal, 2-3 times a day  Potential Side Effects Of Raspberry Leaf:  Most women do not experience any side effects from drinking raspberry leaf tea. However, nausea and loose stools are possible   Use your breast pump or stimulate your nipples   Evening Primrose Oil capsules: may take 1 to 3 capsules daily. May also prick one to release the oil and insert it into your vagina at night.  Some of the potential side effects:  Upset stomach  Loose stools or diarrhea  Headaches  Nausea:

## 2019-06-07 NOTE — Progress Notes (Signed)
Patient declines flu shot today. Rachal Dvorsky RN  

## 2019-06-07 NOTE — Progress Notes (Signed)
   PRENATAL VISIT NOTE  Subjective:  Kelly Cortez is a 23 y.o. G1P0 at [redacted]w[redacted]d being seen today for ongoing prenatal care.  She is currently monitored for the following issues for this low-risk pregnancy and has Supervision of normal first pregnancy, antepartum on their problem list.  Patient reports no complaints.  Contractions: Not present. Vag. Bleeding: None.  Movement: Present. Denies leaking of fluid.   The following portions of the patient's history were reviewed and updated as appropriate: allergies, current medications, past family history, past medical history, past social history, past surgical history and problem list.   Objective:   Vitals:   06/07/19 0854  BP: 121/71  Pulse: 87  Weight: 169 lb (76.7 kg)    Fetal Status: Fetal Heart Rate (bpm): 145 Fundal Height: 36 cm Movement: Present     General:  Alert, oriented and cooperative. Patient is in no acute distress.  Skin: Skin is warm and dry. No rash noted.   Cardiovascular: Normal heart rate noted  Respiratory: Normal respiratory effort, no problems with respiration noted  Abdomen: Soft, gravid, appropriate for gestational age.  Pain/Pressure: Absent     Pelvic: Cervical exam performed Dilation: Fingertip Effacement (%): Thick Station: -3  Extremities: Normal range of motion.  Edema: Trace  Mental Status: Normal mood and affect. Normal behavior. Normal judgment and thought content.   Assessment and Plan:  Pregnancy: G1P0 at [redacted]w[redacted]d 1. Supervision of normal first pregnancy, antepartum  - Culture, beta strep (group b only) - GC/Chlamydia probe amp ()not at [facility] - Discussed cervical ripening at home.   Preterm labor symptoms and general obstetric precautions including but not limited to vaginal bleeding, contractions, leaking of fluid and fetal movement were reviewed in detail with the patient. Please refer to After Visit Summary for other counseling recommendations.   Return in about 1 week (around  06/14/2019), or In person visit.  Future Appointments  Date Time Provider [place]  06/14/2019  8:15 AM Seabron Spates, CNM [facility] None    Noni Saupe, NP

## 2019-06-08 LAB — GC/CHLAMYDIA PROBE AMP (~~LOC~~) NOT AT ARMC
Chlamydia: NEGATIVE
Neisseria Gonorrhea: NEGATIVE

## 2019-06-11 LAB — CULTURE, BETA STREP (GROUP B ONLY): Strep Gp B Culture: NEGATIVE

## 2019-06-14 ENCOUNTER — Ambulatory Visit (INDEPENDENT_AMBULATORY_CARE_PROVIDER_SITE_OTHER): Payer: 59 | Admitting: Advanced Practice Midwife

## 2019-06-14 ENCOUNTER — Other Ambulatory Visit: Payer: Self-pay

## 2019-06-14 ENCOUNTER — Encounter: Payer: Self-pay | Admitting: Advanced Practice Midwife

## 2019-06-14 DIAGNOSIS — Z3400 Encounter for supervision of normal first pregnancy, unspecified trimester: Secondary | ICD-10-CM

## 2019-06-14 DIAGNOSIS — Z3403 Encounter for supervision of normal first pregnancy, third trimester: Secondary | ICD-10-CM

## 2019-06-14 DIAGNOSIS — Z3A37 37 weeks gestation of pregnancy: Secondary | ICD-10-CM

## 2019-06-14 NOTE — Patient Instructions (Signed)

## 2019-06-14 NOTE — Progress Notes (Signed)
   PRENATAL VISIT NOTE  Subjective:  Kelly Cortez is a 23 y.o. G1P0 at [redacted]w[redacted]d being seen today for ongoing prenatal care.  She is currently monitored for the following issues for this low-risk pregnancy and has Supervision of normal first pregnancy, antepartum on their problem list.  Patient reports occasional contractions.  Contractions: Not present. Vag. Bleeding: None.  Movement: Present. Denies leaking of fluid.   The following portions of the patient's history were reviewed and updated as appropriate: allergies, current medications, past family history, past medical history, past social history, past surgical history and problem list.   Objective:   Vitals:   06/14/19 0814  BP: 115/70  Pulse: 92  Weight: 78 kg    Fetal Status: Fetal Heart Rate (bpm): 140   Movement: Present     General:  Alert, oriented and cooperative. Patient is in no acute distress.  Skin: Skin is warm and dry. No rash noted.   Cardiovascular: Normal heart rate noted  Respiratory: Normal respiratory effort, no problems with respiration noted  Abdomen: Soft, gravid, appropriate for gestational age.  Pain/Pressure: Absent     Pelvic: Cervical exam deferred        Extremities: Normal range of motion.  Edema: Trace  Mental Status: Normal mood and affect. Normal behavior. Normal judgment and thought content.   Assessment and Plan:  Pregnancy: G1P0 at [redacted]w[redacted]d 1. Supervision of normal first pregnancy, antepartum       Informed GBS is negative       Signs of labor reviewed.  Had wanted a waterbirth.  Bought tub and supplies but will return them.    Term labor symptoms and general obstetric precautions including but not limited to vaginal bleeding, contractions, leaking of fluid and fetal movement were reviewed in detail with the patient. Please refer to After Visit Summary for other counseling recommendations.   RTO 1 week  Hansel Feinstein, CNM

## 2019-06-21 ENCOUNTER — Other Ambulatory Visit: Payer: Self-pay

## 2019-06-21 ENCOUNTER — Encounter: Payer: Self-pay | Admitting: Advanced Practice Midwife

## 2019-06-21 ENCOUNTER — Ambulatory Visit (INDEPENDENT_AMBULATORY_CARE_PROVIDER_SITE_OTHER): Payer: 59 | Admitting: Advanced Practice Midwife

## 2019-06-21 DIAGNOSIS — Z3A38 38 weeks gestation of pregnancy: Secondary | ICD-10-CM

## 2019-06-21 DIAGNOSIS — Z3400 Encounter for supervision of normal first pregnancy, unspecified trimester: Secondary | ICD-10-CM

## 2019-06-21 DIAGNOSIS — Z3403 Encounter for supervision of normal first pregnancy, third trimester: Secondary | ICD-10-CM

## 2019-06-21 NOTE — Patient Instructions (Signed)

## 2019-06-21 NOTE — Progress Notes (Signed)
   PRENATAL VISIT NOTE  Subjective:  Kelly Cortez is a 23 y.o. G1P0 at [redacted]w[redacted]d being seen today for ongoing prenatal care.  She is currently monitored for the following issues for this low-risk pregnancy and has Supervision of normal first pregnancy, antepartum on their problem list.  Patient reports no complaints.  Contractions: Irregular. Vag. Bleeding: None.  Movement: Present. Denies leaking of fluid.   The following portions of the patient's history were reviewed and updated as appropriate: allergies, current medications, past family history, past medical history, past social history, past surgical history and problem list.   Objective:   Vitals:   06/21/19 0856  BP: 105/71  Pulse: (!) 115  Weight: 79.8 kg    Fetal Status: Fetal Heart Rate (bpm): 143 Fundal Height: 37 cm Movement: Present  Presentation: Vertex  General:  Alert, oriented and cooperative. Patient is in no acute distress.  Skin: Skin is warm and dry. No rash noted.   Cardiovascular: Normal heart rate noted  Respiratory: Normal respiratory effort, no problems with respiration noted  Abdomen: Soft, gravid, appropriate for gestational age.  Pain/Pressure: Present     Pelvic: Cervical exam performed Dilation: Closed Effacement (%): Thick Station: -3  Extremities: Normal range of motion.  Edema: Trace  Mental Status: Normal mood and affect. Normal behavior. Normal judgment and thought content.   Assessment and Plan:  Pregnancy: G1P0 at [redacted]w[redacted]d 1. Supervision of normal first pregnancy, antepartum      Discussed signs of labor      Reviewed where to go for labor eval and other reasons to go to MAU  Term labor symptoms and general obstetric precautions including but not limited to vaginal bleeding, contractions, leaking of fluid and fetal movement were reviewed in detail with the patient. Please refer to After Visit Summary for other counseling recommendations.   Return in about 1 week (around 06/28/2019) for [facility].  Future Appointments  Date Time Provider [place]  06/28/2019  8:45 AM Seabron Spates, CNM [facility] None    Hansel Feinstein, CNM

## 2019-06-28 ENCOUNTER — Other Ambulatory Visit: Payer: Self-pay | Admitting: Advanced Practice Midwife

## 2019-06-28 ENCOUNTER — Ambulatory Visit (INDEPENDENT_AMBULATORY_CARE_PROVIDER_SITE_OTHER): Payer: 59 | Admitting: Advanced Practice Midwife

## 2019-06-28 ENCOUNTER — Encounter: Payer: Self-pay | Admitting: Advanced Practice Midwife

## 2019-06-28 ENCOUNTER — Other Ambulatory Visit: Payer: Self-pay

## 2019-06-28 VITALS — BP 113/67 | HR 104 | Wt 179.0 lb

## 2019-06-28 DIAGNOSIS — Z3403 Encounter for supervision of normal first pregnancy, third trimester: Secondary | ICD-10-CM

## 2019-06-28 DIAGNOSIS — Z3A39 39 weeks gestation of pregnancy: Secondary | ICD-10-CM

## 2019-06-28 DIAGNOSIS — Z3400 Encounter for supervision of normal first pregnancy, unspecified trimester: Secondary | ICD-10-CM

## 2019-06-28 NOTE — Progress Notes (Signed)
   PRENATAL VISIT NOTE  Subjective:  Kelly Cortez is a 23 y.o. G1P0 at [redacted]w[redacted]d being seen today for ongoing prenatal care.  She is currently monitored for the following issues for this low-risk pregnancy and has Supervision of normal first pregnancy, antepartum on their problem list.  Patient reports occasional contractions.  Contractions: Irregular. Vag. Bleeding: None.  Movement: Present. Denies leaking of fluid.   The following portions of the patient's history were reviewed and updated as appropriate: allergies, current medications, past family history, past medical history, past social history, past surgical history and problem list.   Objective:   Vitals:   06/28/19 0853  BP: 113/67  Pulse: (!) 104  Weight: 81.2 kg    Fetal Status: Fetal Heart Rate (bpm): 149 Fundal Height: 40 cm Movement: Present  Presentation: Vertex  General:  Alert, oriented and cooperative. Patient is in no acute distress.  Skin: Skin is warm and dry. No rash noted.   Cardiovascular: Normal heart rate noted  Respiratory: Normal respiratory effort, no problems with respiration noted  Abdomen: Soft, gravid, appropriate for gestational age.  Pain/Pressure: Present     Pelvic: Cervical exam performed Dilation: Closed Effacement (%): Thick Station: -3  Extremities: Normal range of motion.  Edema: Trace  Mental Status: Normal mood and affect. Normal behavior. Normal judgment and thought content.   Assessment and Plan:  Pregnancy at [redacted]w[redacted]d Will be postdates next week Scheduled for IOL on 07/06/2019 I am trying to put in hospital orders but unable to, will keep trying   Term labor symptoms and general obstetric precautions including but not limited to vaginal bleeding, contractions, leaking of fluid and fetal movement were reviewed in detail with the patient. Please refer to After Visit Summary for other counseling recommendations.   Return in about 6 weeks (around 08/09/2019) for [employer].   Future Appointments  Date Time Provider [place]  07/06/2019  9:35 AM [facility] [place] [facility] None  08/09/2019  8:15 AM Seabron Spates, CNM [facility] None    Hansel Feinstein, CNM

## 2019-06-28 NOTE — Progress Notes (Signed)
error 

## 2019-06-28 NOTE — Patient Instructions (Signed)

## 2019-06-28 NOTE — Progress Notes (Signed)
Opened in error

## 2019-06-29 ENCOUNTER — Encounter (HOSPITAL_COMMUNITY): Payer: Self-pay | Admitting: *Deleted

## 2019-06-29 ENCOUNTER — Other Ambulatory Visit: Payer: Self-pay | Admitting: Advanced Practice Midwife

## 2019-06-29 ENCOUNTER — Telehealth (HOSPITAL_COMMUNITY): Payer: Self-pay | Admitting: *Deleted

## 2019-06-29 NOTE — Telephone Encounter (Signed)
Preadmission screen  

## 2019-07-03 ENCOUNTER — Encounter (HOSPITAL_COMMUNITY): Payer: Self-pay

## 2019-07-03 ENCOUNTER — Encounter (HOSPITAL_COMMUNITY): Payer: Self-pay | Admitting: Certified Registered Nurse Anesthetist

## 2019-07-03 ENCOUNTER — Other Ambulatory Visit: Payer: Self-pay

## 2019-07-03 ENCOUNTER — Inpatient Hospital Stay (HOSPITAL_COMMUNITY)
Admission: AD | Admit: 2019-07-03 | Discharge: 2019-07-05 | DRG: 768 | Disposition: A | Discharge status: Home / Self Care | Payer: 59 | Attending: Obstetrics and Gynecology | Admitting: Obstetrics and Gynecology

## 2019-07-03 DIAGNOSIS — O480 Post-term pregnancy: Secondary | ICD-10-CM | POA: Diagnosis present

## 2019-07-03 DIAGNOSIS — Z3689 Encounter for other specified antenatal screening: Secondary | ICD-10-CM | POA: Diagnosis not present

## 2019-07-03 DIAGNOSIS — Z20828 Contact with and (suspected) exposure to other viral communicable diseases: Secondary | ICD-10-CM | POA: Diagnosis present

## 2019-07-03 DIAGNOSIS — Z3A40 40 weeks gestation of pregnancy: Secondary | ICD-10-CM

## 2019-07-03 DIAGNOSIS — O26893 Other specified pregnancy related conditions, third trimester: Secondary | ICD-10-CM | POA: Diagnosis present

## 2019-07-03 DIAGNOSIS — Z3400 Encounter for supervision of normal first pregnancy, unspecified trimester: Secondary | ICD-10-CM

## 2019-07-03 LAB — CBC
HCT: 42.5 % (ref 36.0–46.0)
Hemoglobin: 14.3 g/dL (ref 12.0–15.0)
MCH: 29.5 pg (ref 26.0–34.0)
MCHC: 33.6 g/dL (ref 30.0–36.0)
MCV: 87.8 fL (ref 80.0–100.0)
Platelets: 151 10*3/uL (ref 150–400)
RBC: 4.84 MIL/uL (ref 3.87–5.11)
RDW: 14.8 % (ref 11.5–15.5)
WBC: 11.2 10*3/uL — ABNORMAL HIGH (ref 4.0–10.5)
nRBC: 0 % (ref 0.0–0.2)

## 2019-07-03 LAB — TYPE AND SCREEN
ABO/RH(D): O POS
Antibody Screen: NEGATIVE

## 2019-07-03 LAB — ABO/RH: ABO/RH(D): O POS

## 2019-07-03 LAB — SARS CORONAVIRUS 2 BY RT PCR (HOSPITAL ORDER, PERFORMED IN ~~LOC~~ HOSPITAL LAB): SARS Coronavirus 2: NEGATIVE

## 2019-07-03 MED ORDER — OXYCODONE-ACETAMINOPHEN 5-325 MG PO TABS: 1.0000 | ORAL_TABLET | ORAL | Status: DC | PRN

## 2019-07-03 MED ORDER — SOD CITRATE-CITRIC ACID 500-334 MG/5ML PO SOLN: 30.0000 mL | ORAL | Status: DC | PRN

## 2019-07-03 MED ORDER — MISOPROSTOL 50MCG HALF TABLET
50.0000 ug | ORAL_TABLET | ORAL | Status: DC
  Administered 2019-07-03: 50 ug via ORAL
  Filled 2019-07-03: qty 1

## 2019-07-03 MED ORDER — TERBUTALINE SULFATE 1 MG/ML IJ SOLN: 0.2500 mg | Freq: Once | INTRAMUSCULAR | Status: DC | PRN

## 2019-07-03 MED ORDER — OXYTOCIN BOLUS FROM INFUSION: 500.0000 mL | Freq: Once | INTRAVENOUS | Status: DC

## 2019-07-03 MED ORDER — FLEET ENEMA 7-19 GM/118ML RE ENEM: 1.0000 | ENEMA | Freq: Every day | RECTAL | Status: DC | PRN

## 2019-07-03 MED ORDER — LACTATED RINGERS IV SOLN: 500.0000 mL | INTRAVENOUS | Status: DC | PRN

## 2019-07-03 MED ORDER — LACTATED RINGERS IV SOLN
INTRAVENOUS | Status: DC
  Administered 2019-07-03 (×2): via INTRAVENOUS

## 2019-07-03 MED ORDER — ACETAMINOPHEN 325 MG PO TABS: 650.0000 mg | ORAL_TABLET | ORAL | Status: DC | PRN

## 2019-07-03 MED ORDER — OXYCODONE-ACETAMINOPHEN 5-325 MG PO TABS: 2.0000 | ORAL_TABLET | ORAL | Status: DC | PRN

## 2019-07-03 MED ORDER — OXYTOCIN BOLUS FROM INFUSION
500.0000 mL | Freq: Once | INTRAVENOUS | Status: AC
  Administered 2019-07-04: 500 mL via INTRAVENOUS

## 2019-07-03 MED ORDER — LIDOCAINE HCL (PF) 1 % IJ SOLN: 30.0000 mL | INTRAMUSCULAR | Status: DC | PRN

## 2019-07-03 MED ORDER — MISOPROSTOL 25 MCG QUARTER TABLET
25.0000 ug | ORAL_TABLET | ORAL | Status: DC | PRN
  Filled 2019-07-03: qty 1

## 2019-07-03 MED ORDER — OXYTOCIN 40 UNITS IN NORMAL SALINE INFUSION - SIMPLE MED: 2.5000 [IU]/h | INTRAVENOUS | Status: DC

## 2019-07-03 MED ORDER — ONDANSETRON HCL 4 MG/2ML IJ SOLN: 4.0000 mg | Freq: Four times a day (QID) | INTRAMUSCULAR | Status: DC | PRN

## 2019-07-03 MED ORDER — LACTATED RINGERS IV SOLN: INTRAVENOUS | Status: DC

## 2019-07-03 MED ORDER — OXYTOCIN 40 UNITS IN NORMAL SALINE INFUSION - SIMPLE MED
1.0000 m[IU]/min | INTRAVENOUS | Status: DC
  Filled 2019-07-03: qty 1000

## 2019-07-03 MED ORDER — LIDOCAINE HCL (PF) 1 % IJ SOLN
30.0000 mL | INTRAMUSCULAR | Status: AC | PRN
  Administered 2019-07-04: 30 mL via SUBCUTANEOUS
  Filled 2019-07-03: qty 30

## 2019-07-03 MED ORDER — FENTANYL CITRATE (PF) 100 MCG/2ML IJ SOLN
100.0000 ug | INTRAMUSCULAR | Status: DC | PRN
  Administered 2019-07-03 – 2019-07-04 (×5): 100 ug via INTRAVENOUS
  Filled 2019-07-03 (×5): qty 2

## 2019-07-03 NOTE — Progress Notes (Signed)
LABOR PROGRESS NOTE  Kelly Cortez is a 23 y.o. G1P0 at [redacted]w[redacted]d  admitted for early labor.  Subjective: Foley bulb came out Starting to feel contractions much more strongly now  Objective: BP 117/75   Pulse (!) 102   Temp 98.5 F (36.9 C) (Oral)   Resp 18   Ht 5\' 2"  (1.575 m)   Wt 81.9 kg   LMP 06/17/2018   SpO2 100%   BMI 33.01 kg/m  or  Vitals:   07/03/19 1400 07/03/19 1501 07/03/19 1700 07/03/19 1800  BP: (!) 110/57 124/79 109/63 117/75  Pulse: 84 94 81 (!) 102  Resp: 16 16 16 18   Temp:   98.5 F (36.9 C)   TempSrc:   Oral   SpO2:      Weight:      Height:         Dilation: 4.5 Effacement (%): 70 Cervical Position: Posterior Station: -2 Presentation: Vertex Exam by:: SRussell, RN  FHT: baseline rate 150, minimal varibility, -acel, early decels Toco: q2-3 min  Labs: Lab Results  Component Value Date   WBC 11.2 (H) 07/03/2019   HGB 14.3 07/03/2019   HCT 42.5 07/03/2019   MCV 87.8 07/03/2019   PLT 151 07/03/2019    Patient Active Problem List   Diagnosis Date Noted  . Labor and delivery, indication for care 07/03/2019  . Post term pregnancy over 40 weeks 07/03/2019  . Supervision of normal first pregnancy, antepartum 03/28/2019    Assessment / Plan: 23 y.o. G1P0 at [redacted]w[redacted]d here for early labor.  Labor: s/p FB with good progress from 1cm-->4/70/-2. Recommended patient start pitocin however prefers to wait as her contractions have become much stronger. Reasonable to defer for 1-2 hours as she is in good contraction pattern, will reassess in several hours.  Fetal Wellbeing:  Currently Cat II for variability but appears to be sleep cycle, preceded immediately by period of reassuring tracing, ctm.  Pain Control:  None at present, prefers IV pain meds, not planning epidural Anticipated MOD:  SVD  Augustin Coupe, MD/[facility] OB Fellow  07/03/2019, 6:25 PM

## 2019-07-03 NOTE — H&P (Signed)
LABOR AND DELIVERY ADMISSION HISTORY AND PHYSICAL NOTE  Kelly Cortez is a 23 y.o. female G1P0 with IUP at 33w4dby LMP c/w 13 wk UKoreapresenting for early labor.   She reports positive fetal movement. She denies leakage of fluid or vaginal bleeding.   She plans on breast feeding. She plans on condoms for birth control.  Prenatal History/Complications: PNC at [facility]  '@[redacted]w[redacted]d'$ , CWD, normal anatomy, cephalic presentation, 405%LZJ EFW 1943, posterior placenta Pregnancy complications:  - none  Past Medical History: Past Medical History:  Diagnosis Date  . Medical history non-contributory     Past Surgical History: Past Surgical History:  Procedure Laterality Date  . NO PAST SURGERIES      Obstetrical History: OB History    Gravida  1   Para      Term      Preterm      AB      Living        SAB      TAB      Ectopic      Multiple      Live Births              Social History: Social History   Socioeconomic History  . Marital status: Single    Spouse name: Not on file  . Number of children: Not on file  . Years of education: Not on file  . Highest education level: Not on file  Occupational History  . Not on file  Social Needs  . Financial resource strain: Not on file  . Food insecurity    Worry: Not on file    Inability: Not on file  . Transportation needs    Medical: Not on file    Non-medical: Not on file  Tobacco Use  . Smoking status: Never Smoker  . Smokeless tobacco: Never Used  Substance and Sexual Activity  . Alcohol use: Not Currently  . Drug use: Not Currently  . Sexual activity: Yes    Birth control/protection: None  Lifestyle  . Physical activity    Days per week: Not on file    Minutes per session: Not on file  . Stress: Not on file  Relationships  . Social [place] phone: Not on file    Gets together: Not on file    Attends religious service: Not on file    Active member of club or organization:  Not on file    Attends meetings of clubs or organizations: Not on file    Relationship status: Not on file  Other Topics Concern  . Not on file  Social History Narrative  . Not on file    Family History: History reviewed. No pertinent family history.  Allergies: No Known Allergies  Medications Prior to Admission  Medication Sig Dispense Refill Last Dose  . EVENING PRIMROSE OIL PO Take by mouth.   Past Week at Unknown time  . Prenatal MV-Min-Fe Fum-FA-DHA (VITAFOL-OB+DHA) 65-1 & 250 MG MISC Take 1 capsule by mouth daily. 30 each 12 07/02/2019 at Unknown time  . AMBULATORY NON FORMULARY MEDICATION 1 Device by Other route once a week. Blood pressure cuff Monitored regularly at home  ICD Z34.90 1 kit 0      Review of Systems  All systems reviewed and negative except as stated in HPI  Physical Exam Blood pressure (!) 103/55, pulse 71, temperature 97.9 F (36.6 C), temperature source Oral, resp. rate 18, height '5\' 2"'$  (  1.575 m), weight 81.9 kg, last menstrual period 06/17/2018, SpO2 100 %. General appearance: alert, oriented, NAD Lungs: normal respiratory effort Heart: regular rate Abdomen: soft, non-tender; gravid, leopolds 3000g Extremities: No calf swelling or tenderness Presentation: cephalic by exam Fetal monitoringBaseline: 140 bpm, Variability: Good {> 6 bpm), Accelerations: Reactive and Decelerations: Variable: mild Uterine activityFrequency: Every 2-6 minutes Dilation: 1 Effacement (%): 60 Station: -2 Exam by:: Dr. Dione Plover  Prenatal labs: ABO, Rh: --/--/O POS, O POS Performed at [facility], [place]. [address] [place], [place] [address]  [phone]/27 0901) pending, O+ per outside records 12/23/2018 Antibody: NEG (09/27 0901) pending, neg per outside records 12/23/2018 Rubella:   immune by outside records 12/23/2018 RPR: Non Reactive (06/25 0931)  HBsAg:   NR per outside records 12/23/2018 HIV: Non Reactive (06/25 0931)  GC/Chlamydia: neg/neg 06/07/2019  GBS: Negative/--  (09/01 0858)  2-hr GTT: nl 03/31/2019 Genetic screening:  Normal NIPS 12/23/2018 in outside records Anatomy US: normal   Prenatal Transfer Tool  Maternal Diabetes: No Genetic Screening: Normal Maternal Ultrasounds/Referrals: Normal Fetal Ultrasounds or other Referrals:  None Maternal Substance Abuse:  No Significant Maternal Medications:  None Significant Maternal Lab Results: Group B Strep negative  Results for orders placed or performed during the hospital encounter of 07/03/19 (from the past 24 hour(s))  SARS Coronavirus 2 [facility] order, Performed in [facility] hospital lab) Nasopharyngeal Nasopharyngeal Swab   Collection Time: 07/03/19  8:06 AM   Specimen: Nasopharyngeal Swab  Result Value Ref Range   SARS Coronavirus 2 NEGATIVE NEGATIVE  Type and screen [place]   Collection Time: 07/03/19  9:01 AM  Result Value Ref Range   ABO/RH(D) O POS    Antibody Screen NEG    Sample Expiration      07/06/2019,2359 Performed at [facility], [place] [address], [place], [place] [address]   ABO/Rh   Collection Time: 07/03/19  9:01 AM  Result Value Ref Range   ABO/RH(D)      O POS Performed at [place] [address], [place], [place] [address]   CBC   Collection Time: 07/03/19  9:02 AM  Result Value Ref Range   WBC 11.2 (H) 4.0 - 10.5 K/uL   RBC 4.84 3.87 - 5.11 MIL/uL   Hemoglobin 14.3 12.0 - 15.0 g/dL   HCT 42.5 36.0 - 46.0 %   MCV 87.8 80.0 - 100.0 fL   MCH 29.5 26.0 - 34.0 pg   MCHC 33.6 30.0 - 36.0 g/dL   RDW 14.8 11.5 - 15.5 %   Platelets 151 150 - 400 K/uL   nRBC 0.0 0.0 - 0.2 %    Patient Active Problem List   Diagnosis Date Noted  . Labor and delivery, indication for care 07/03/2019  . Post term pregnancy over 40 weeks 07/03/2019  . Supervision of normal first pregnancy, antepartum 03/28/2019    Assessment: Kelly Cortez is a 23 y.o. G1P0 at 73w4dhere for early labor.  #Labor: Foley bulb placed at 1230, currently  contracting too often for miso but if spaces will plan to give. #Pain: IV pain meds, not planning on epidural #FWB: Cat II for variable decel but overall reassuring with moderate variability and accels present #GBS/ID:  negative #COVID: swab negative #MOF: Breast #MOC:condoms #Circ:  n/a  MClarnce Flock9/27/2020, 1:01 PM

## 2019-07-03 NOTE — Progress Notes (Signed)
07/03/2019 - 8:38 PM  23 y.o. G1P0 [redacted]w[redacted]d   Pregnancy uncomplicated  Patient Active Problem List   Diagnosis Date Noted  . Labor and delivery, indication for care 07/03/2019  . Post term pregnancy over 40 weeks 07/03/2019  . Supervision of normal first pregnancy, antepartum 03/28/2019    [name] is admitted for SOL   Subjective:  Doing well, feeling contractions more often, pressure with contractions Objective:   Vitals:   07/03/19 1800 07/03/19 1840 07/03/19 1900 07/03/19 2028  BP: 117/75 (!) 111/57 117/67 125/79  Pulse: (!) 102 77 88 98  Resp: 18 16 16    Temp:    98.6 F (37 C)  TempSrc:    Oral  SpO2:      Weight:      Height:        Current Vital Signs 24h Vital Sign Ranges  T 98.6 F (37 C) Temp  Avg: 98.3 F (36.8 C)  Min: 97.9 F (36.6 C)  Max: 98.6 F (37 C)  BP 125/79 BP  Min: 103/55  Max: 137/81  HR 98 Pulse  Avg: 86.2  Min: 71  Max: 102  RR 16 Resp  Avg: 16.9  Min: 16  Max: 19  SaO2 100 %   SpO2  Avg: 100 %  Min: 100 %  Max: 100 %       24 Hour I/O Current Shift I/O  Time Ins Outs No intake/output data recorded. No intake/output data recorded.   FHR: 145  baseline, good variability, + accels, variable decels.  Toco: q1-29mins SVE: 5-6/75/-2 to -1  AROM with clear fluid   Assessment & Plan:  FHT CAT 2, variables since AROM GBS: neg Pitocin none Analgesia: prn available

## 2019-07-03 NOTE — Progress Notes (Signed)
Vertex presentation confirmed with bedside ultrasound.  Kelly Snooks, MSN, CNM MAU Provider Certified Nurse Midwife, Faculty Practice 07/03/19 8:10 AM

## 2019-07-03 NOTE — MAU Note (Signed)
Patient presents to MAU c/o ctx 5-7 mins apart since around 0130. +fm, denies vaginal bleeding or LOF.

## 2019-07-03 NOTE — Progress Notes (Signed)
07/03/2019 - 11:52 PM  23 y.o. G1P0 [redacted]w[redacted]d   Pregnancy uncomplicated  Patient Active Problem List   Diagnosis Date Noted  . Labor and delivery, indication for care 07/03/2019  . Post term pregnancy over 40 weeks 07/03/2019  . Supervision of normal first pregnancy, antepartum 03/28/2019    [name] is admitted for SOL   Subjective:  Uncomfortable with contractions, wants to try a shower Objective:   Vitals:   07/03/19 1840 07/03/19 1900 07/03/19 2028 07/03/19 2158  BP: (!) 111/57 117/67 125/79 117/74  Pulse: 77 88 98 84  Resp: 16 16    Temp:   98.6 F (37 C)   TempSrc:   Oral   SpO2:      Weight:      Height:        Current Vital Signs 24h Vital Sign Ranges  T 98.6 F (37 C) Temp  Avg: 98.3 F (36.8 C)  Min: 97.9 F (36.6 C)  Max: 98.6 F (37 C)  BP 117/74 BP  Min: 103/55  Max: 137/81  HR 84 Pulse  Avg: 86.1  Min: 71  Max: 102  RR 16 Resp  Avg: 16.9  Min: 16  Max: 19  SaO2 100 %   SpO2  Avg: 100 %  Min: 100 %  Max: 100 %       24 Hour I/O Current Shift I/O  Time Ins Outs No intake/output data recorded. No intake/output data recorded.   FHR: 155 baseline, good variability  No accels, 1 prolonged, variable decels.  Toco: q2-20min, coupling SVE: 7/90/-1 to 0   Assessment & Plan:  FHT CAT 1, with frequent repositioning for improvement of variables GBS: neg Pitocin none Analgesia: prn fentanyl  Will allow for short period of intermittent monitoring as mom would like shower for pain relief

## 2019-07-04 ENCOUNTER — Inpatient Hospital Stay (HOSPITAL_COMMUNITY)
Admission: RE | Admit: 2019-07-04 | Discharge: 2019-07-04 | Disposition: A | Discharge status: Home / Self Care | Payer: 59 | Source: Ambulatory Visit

## 2019-07-04 ENCOUNTER — Encounter (HOSPITAL_COMMUNITY): Payer: Self-pay

## 2019-07-04 DIAGNOSIS — O480 Post-term pregnancy: Secondary | ICD-10-CM

## 2019-07-04 DIAGNOSIS — Z3A40 40 weeks gestation of pregnancy: Secondary | ICD-10-CM

## 2019-07-04 MED ORDER — ONDANSETRON HCL 4 MG/2ML IJ SOLN: 4.0000 mg | INTRAMUSCULAR | Status: DC | PRN

## 2019-07-04 MED ORDER — ONDANSETRON HCL 4 MG PO TABS: 4.0000 mg | ORAL_TABLET | ORAL | Status: DC | PRN

## 2019-07-04 MED ORDER — WITCH HAZEL-GLYCERIN EX PADS: 1.0000 "application " | MEDICATED_PAD | CUTANEOUS | Status: DC | PRN

## 2019-07-04 MED ORDER — DIPHENHYDRAMINE HCL 25 MG PO CAPS: 25.0000 mg | ORAL_CAPSULE | Freq: Four times a day (QID) | ORAL | Status: DC | PRN

## 2019-07-04 MED ORDER — PRENATAL MULTIVITAMIN CH
1.0000 | ORAL_TABLET | Freq: Every day | ORAL | Status: DC
  Administered 2019-07-04 – 2019-07-05 (×2): 1 via ORAL
  Filled 2019-07-04 (×2): qty 1

## 2019-07-04 MED ORDER — IBUPROFEN 600 MG PO TABS
600.0000 mg | ORAL_TABLET | Freq: Three times a day (TID) | ORAL | Status: DC | PRN
  Administered 2019-07-05 (×2): 600 mg via ORAL
  Filled 2019-07-04 (×2): qty 1

## 2019-07-04 MED ORDER — ACETAMINOPHEN 325 MG PO TABS: 650.0000 mg | ORAL_TABLET | Freq: Four times a day (QID) | ORAL | Status: DC | PRN

## 2019-07-04 MED ORDER — COCONUT OIL OIL: 1.0000 "application " | TOPICAL_OIL | Status: DC | PRN

## 2019-07-04 MED ORDER — POLYETHYLENE GLYCOL 3350 17 G PO PACK
17.0000 g | PACK | Freq: Every day | ORAL | Status: DC
  Filled 2019-07-04: qty 1

## 2019-07-04 MED ORDER — CEFAZOLIN SODIUM-DEXTROSE 2-4 GM/100ML-% IV SOLN
2.0000 g | Freq: Once | INTRAVENOUS | Status: AC
  Administered 2019-07-04: 04:00:00 2 g via INTRAVENOUS
  Filled 2019-07-04: qty 100

## 2019-07-04 MED ORDER — BENZOCAINE-MENTHOL 20-0.5 % EX AERO
1.0000 "application " | INHALATION_SPRAY | CUTANEOUS | Status: DC | PRN
  Filled 2019-07-04: qty 56

## 2019-07-04 MED ORDER — SIMETHICONE 80 MG PO CHEW: 80.0000 mg | CHEWABLE_TABLET | ORAL | Status: DC | PRN

## 2019-07-04 MED ORDER — TETANUS-DIPHTH-ACELL PERTUSSIS 5-2.5-18.5 LF-MCG/0.5 IM SUSP: 0.5000 mL | Freq: Once | INTRAMUSCULAR | Status: DC

## 2019-07-04 MED ORDER — MEASLES, MUMPS & RUBELLA VAC IJ SOLR: 0.5000 mL | Freq: Once | INTRAMUSCULAR | Status: DC

## 2019-07-04 MED ORDER — SENNOSIDES-DOCUSATE SODIUM 8.6-50 MG PO TABS
2.0000 | ORAL_TABLET | ORAL | Status: DC
  Administered 2019-07-05: 2 via ORAL
  Filled 2019-07-04: qty 2

## 2019-07-04 MED ORDER — DIBUCAINE (PERIANAL) 1 % EX OINT: 1.0000 "application " | TOPICAL_OINTMENT | CUTANEOUS | Status: DC | PRN

## 2019-07-04 NOTE — Discharge Summary (Signed)
Postpartum Discharge Summary    Patient Name: Kelly Cortez DOB: May 23, 1996 MRN: [id number]  Date of admission: 07/03/2019 Delivering Provider: Drinda Butts E   Date of discharge: 07/05/2019  Admitting diagnosis: 39 wks ctx every 5 min Intrauterine pregnancy: [redacted]w[redacted]d    Secondary diagnosis:  Active Problems:   Labor and delivery, indication for care   Post term pregnancy over 40 weeks   Third degree laceration of perineum, type 3a  Additional problems: None     Discharge diagnosis: Term Pregnancy Delivered                                                                                                Post partum procedures:None  Augmentation: AROM, Cytotec and Foley Balloon  Complications: None  Hospital course:  Onset of Labor With Vaginal Delivery     23y.o. yo G1P0 at 433w5das admitted in Latent Labor on 07/03/2019. Patient had an uncomplicated labor course as follows. Patient presented to L&D for SOL. Initial SVE: 1/60/-2. Patient received Cytotec, Foley bulb and AROM. She then progressed to complete. Patient declined epidural. Patient with 3a perineal laceration and therefore given 2 g Ancef postpartum along with bowel regimen.  Membrane Rupture Time/Date: 8:35 PM ,07/03/2019   Intrapartum Procedures: Episiotomy: None [1]                                         Lacerations:  3rd degree [4]  Patient had a delivery of a Viable infant. 07/04/2019  Information for the patient's newborn:  RaLuma, Clopper0[[id number]]Delivery Method: Vaginal, Spontaneous(Filed from Delivery Summary)     Pateint had an uncomplicated postpartum course.  She is ambulating, tolerating a regular diet, passing flatus, and urinating well. She opted for condoms for birth control. Patient is discharged home in stable condition on 07/05/19.  Delivery time: 3:01 AM    Magnesium Sulfate received: No BMZ received: No Rhophylac:No MMR:No Transfusion:No  Physical exam  Vitals:   07/04/19 1241  07/04/19 1749 07/04/19 2057 07/05/19 0148  BP: 107/67 105/83 112/63   Pulse: (!) 117 100 (!) 108   Resp: '18 18 16   '$ Temp: 98.2 F (36.8 C) 98.1 F (36.7 C) 98.4 F (36.9 C) 98.6 F (37 C)  TempSrc: Oral Oral Oral Axillary  SpO2:   100%   Weight:      Height:       General: alert, cooperative and no distress Lochia: appropriate Uterine Fundus: firm DVT Evaluation: No evidence of DVT seen on physical exam. Labs: Lab Results  Component Value Date   WBC 11.2 (H) 07/03/2019   HGB 14.3 07/03/2019   HCT 42.5 07/03/2019   MCV 87.8 07/03/2019   PLT 151 07/03/2019   No flowsheet data found.  Discharge instruction: per After Visit Summary and "Baby and Me Booklet".  After visit meds:  Allergies as of 07/05/2019   No Known Allergies     Medication List    TAKE these medications   acetaminophen  325 MG tablet Commonly known as: Tylenol Take 2 tablets (650 mg total) by mouth every 6 (six) hours as needed (for pain scale < 4).   AMBULATORY NON FORMULARY MEDICATION 1 Device by Other route once a week. Blood pressure cuff Monitored regularly at home  ICD Z34.90   EVENING PRIMROSE OIL PO Take by mouth.   ibuprofen 600 MG tablet Commonly known as: ADVIL Take 1 tablet (600 mg total) by mouth every 8 (eight) hours as needed for mild pain.   polyethylene glycol 17 g packet Commonly known as: MIRALAX / GLYCOLAX Take 17 g by mouth daily.   senna-docusate 8.6-50 MG tablet Commonly known as: Senokot-S Take 2 tablets by mouth daily. Start taking on: July 06, 2019   Vitafol-OB+DHA 65-1 & 250 MG Misc Take 1 capsule by mouth daily.   witch hazel-glycerin pad Commonly known as: TUCKS Apply 1 application topically as needed for hemorrhoids.       Diet: routine diet  Activity: Advance as tolerated. Pelvic rest for 6 weeks.   Outpatient follow up:4 weeks Follow up Appt: Future Appointments  Date Time Provider [place]  08/09/2019  8:15 AM Seabron Spates,  CNM [facility] None   Follow up Visit:   Please schedule this patient for Postpartum visit in: 4 weeks with the following provider: MD Low risk pregnancy complicated by: None Delivery mode:  SVD Anticipated Birth Control:  Condoms PP Procedures needed: 3rd degree repair assessment  Schedule Integrated BH visit: no      Newborn Data: Live born female  Birth Weight:  3065g APGAR: 16, 9  Newborn Delivery   Birth date/time: 07/04/2019 03:01:00 Delivery type: Vaginal, Spontaneous      Baby Feeding: Breast Disposition:home with mother   07/05/2019 Chauncey Mann, MD

## 2019-07-04 NOTE — Lactation Note (Signed)
This note was copied from a baby's chart. Lactation Consultation Note  Patient Name: [name] [id number] Date: 07/04/2019   Infant's 1st feeding at the breast was at 10 hours of age. Mom is large-breasted with flat nipples, but her breasts are compressible. The teacup hold was used to help facilitate latch. Infant's activity/ability at the breast is age-appropriate, considering this is her 1st time attempting to feed.  After a couple of swallows were heard, I left Mom & baby to enjoy this first experience together.   Mom comes from a tradition of breastfeeding mothers & was able to identify the sound of swallowing.   I will follow up with Mom later.    Matthias Hughs [facility] 07/04/2019, 12:59 PM

## 2019-07-04 NOTE — Lactation Note (Signed)
This note was copied from a baby's chart. Lactation Consultation Note  Patient Name: Kelly Cortez [id number] Date: 07/04/2019 Reason for consult: Follow-up assessment  LC Follow Up Visit:  Mother attempting to rest; baby asleep in the bassinet.  Previous LC asked me to follow up with mother; I will have to return due to mother's need to rest.  Mother informed me that the baby fed, "pooped" and fell back to sleep.  Previous [place] notified current RN of the need to follow up and be mindful of the baby's need to feed.  RN verbalized understanding and will check on mother approximately 1700.     Consult Status Consult Status: Follow-up Date: 07/05/19 Follow-up type: In-patient    [name] 07/04/2019, 4:19 PM

## 2019-07-04 NOTE — Lactation Note (Addendum)
This note was copied from a baby's chart. Lactation Consultation Note  Patient Name: Kelly Cortez [id number] Date: 07/04/2019   Initial visit at 7 hours of life. Mom reports + breast changes w/pregnancy.    Infant has not yet fed & Mom said infant did not show interest when placed skin-to-skin on Labor & Delivery. We placed infant skin-to-skin on Mom. Mom's breakfast tray then arrived & we learned that Mom had not yet eaten since birth. Infant was placed skin-to-skin on Dad.   Mom will call for [place] to return when she finishes eating breakfast.    Matthias Hughs [facility] 07/04/2019, 10:23 AM

## 2019-07-04 NOTE — Lactation Note (Signed)
This note was copied from a baby's chart. Lactation Consultation Note  Patient Name: Kelly Cortez [id number] Date: 07/04/2019   Mom is trying to sleep. Dad is continuing to do skin-to-skin with infant.    Matthias Hughs [facility] 07/04/2019, 12:27 PM

## 2019-07-04 NOTE — Progress Notes (Signed)
Labor Progress Note Kelly Cortez is a 23 y.o. G1P0 at [redacted]w[redacted]d presented for SOL. S: Feeling more pressure.  O:  BP 113/60   Pulse (!) 110   Temp 98.2 F (36.8 C) (Oral)   Resp 16   Ht 5\' 2"  (1.575 m)   Wt 81.9 kg   LMP 06/17/2018   SpO2 100%   BMI 33.01 kg/m  EFM: 150, minimal to moderate variability, intermittent variables, pos accels Ctx: q2-77m  CVE: Dilation: 8 Effacement (%): 90 Cervical Position: Posterior Station: Plus 1 Presentation: Vertex Exam by:: Dr. Marice Potter   A&P: 23 y.o. G1P0 [redacted]w[redacted]d here for SOL. #Labor: Progressing well. S/p AROM. Will try peanut ball. Anticipate SVD soon.  #Pain: Declines epidural; s/p Fentanyl #FWB: Cat II #GBS negative  Chauncey Mann, MD 2:05 AM

## 2019-07-05 ENCOUNTER — Ambulatory Visit: Payer: Self-pay

## 2019-07-05 LAB — RPR: RPR Ser Ql: NONREACTIVE — AB

## 2019-07-05 MED ORDER — IBUPROFEN 600 MG PO TABS: 600.0000 mg | ORAL_TABLET | Freq: Three times a day (TID) | ORAL | 0 refills | Status: AC | PRN

## 2019-07-05 MED ORDER — ACETAMINOPHEN 325 MG PO TABS: 650.0000 mg | ORAL_TABLET | Freq: Four times a day (QID) | ORAL | 0 refills | Status: AC | PRN

## 2019-07-05 MED ORDER — POLYETHYLENE GLYCOL 3350 17 G PO PACK: 17.0000 g | PACK | Freq: Every day | ORAL | 0 refills | Status: AC

## 2019-07-05 MED ORDER — SENNOSIDES-DOCUSATE SODIUM 8.6-50 MG PO TABS: 2.0000 | ORAL_TABLET | ORAL | 0 refills | Status: AC

## 2019-07-05 MED ORDER — WITCH HAZEL-GLYCERIN EX PADS: 1.0000 "application " | MEDICATED_PAD | CUTANEOUS | 12 refills | Status: AC | PRN

## 2019-07-05 NOTE — Lactation Note (Signed)
This note was copied from a baby's chart. Lactation Consultation Note  Patient Name: Kelly Cortez [id number] Date: 07/05/2019 Reason for consult: Follow-up assessment Mom attempting to latch baby using football hold.  Mom reports that baby latched well during the night and fed frequently.  Small drop of colostrum hand expressed.  Baby would latch briefly but would not sustain a latch.  Baby sleepy and waking techniques done.  Mom will attempt again with cues or in one hour.  Instructed to call for assist prn.  Discussed milk coming to volume and the prevention and treatment of engorgement.  She has a breast pump at home.  Instructed to continue to feed with cues or at least 8 times in 24 hours.  Maternal Data    Feeding Feeding Type: Breast Fed  LATCH Score                   Interventions    Lactation Tools Discussed/Used     Consult Status Consult Status: Complete Date: 07/05/19 Follow-up type: Call as needed    Ave Filter 07/05/2019, 11:29 AM

## 2019-07-05 NOTE — Lactation Note (Signed)
This note was copied from a baby's chart. Lactation Consultation Note  Patient Name: Kelly Cortez [id number] Date: 07/05/2019 Reason for consult: Follow-up assessment;Difficult latch Mom attempting to feed baby in football hold.  Baby remains sleepy.  She latches with a shallow latch but does not sustain latch more than a minute or two.  24 mm nipple shield applied.  Baby was able to sustain latch better but not obtaining depth.  Shield changed to a 20 mm and baby had a better latch.  Observed sucking off and on for 10 minutes.  Very few swallows noted. One or two drops of colostrum in shield after feeding.  Mom can easily hand express milk.  Instructed to use DEBP and syringe feed milk to baby.  With additional calories baby may have better energy to breastfeed.  Instructed to pump every 3 hours.  Bilirubin will be drawn at 1500.  Mom states she is comfortable with syringe feeding.  Encouraged to call for assist prn.  Maternal Data    Feeding Feeding Type: Breast Fed  LATCH Score Latch: Repeated attempts needed to sustain latch, nipple held in mouth throughout feeding, stimulation needed to elicit sucking reflex.  Audible Swallowing: A few with stimulation  Type of Nipple: Everted at rest and after stimulation  Comfort (Breast/Nipple): Soft / non-tender  Hold (Positioning): Assistance needed to correctly position infant at breast and maintain latch.  LATCH Score: 7  Interventions Interventions: Breast compression;Assisted with latch;Adjust position;Skin to skin;Support pillows;Breast massage;Hand express  Lactation Tools Discussed/Used     Consult Status Consult Status: Follow-up Date: 07/06/19 Follow-up type: In-patient    Ave Filter 07/05/2019, 1:40 PM

## 2019-07-05 NOTE — Lactation Note (Signed)
This note was copied from a baby's chart. Lactation Consultation Note  Patient Name: Kelly Cortez [id number] Date: 07/05/2019 Reason for consult: Follow-up assessment;Primapara;Term Baby is 30 hours old/5% weight loss.  Mom is currently sleeping and FOB states baby ate 2 hours ago.  Baby is sleeping in crib.  Instructed to call for feeding assessment when baby is ready to feed.  Maternal Data    Feeding Feeding Type: Breast Fed  LATCH Score                   Interventions    Lactation Tools Discussed/Used     Consult Status Consult Status: Follow-up Date: 07/05/19 Follow-up type: In-patient    Ave Filter 07/05/2019, 9:27 AM

## 2019-07-06 ENCOUNTER — Ambulatory Visit: Payer: Self-pay

## 2019-07-06 ENCOUNTER — Inpatient Hospital Stay (HOSPITAL_COMMUNITY): Payer: 59

## 2019-07-06 ENCOUNTER — Inpatient Hospital Stay (HOSPITAL_COMMUNITY): Admission: AD | Admit: 2019-07-06 | Payer: 59 | Source: Home / Self Care | Admitting: Obstetrics & Gynecology

## 2019-07-06 NOTE — Lactation Note (Signed)
This note was copied from a baby's chart. Lactation Consultation Note  Patient Name: Kelly Cortez [id number] Date: 07/06/2019 Reason for consult: Follow-up assessment;Difficult latch;Hyperbilirubinemia;Nipple pain/trauma  Mom is bf infant and then will bottle feed for next feed.  Infant fed well per mom and has been taking 30 ml.  Mom has 53 ml ebm she recently pumped in refrigerator.  Mom has sore nipples.  Compression stripe noted on both sides.  She is using coconut oil but feels it is not helping.    Infant cueing, LC offered assistance with feed.  With 24 NS mom attempted to bf, infant fed on and off for 5 minutes and a couple of swallows heard after prefillng shield but none after that.  Infant became fussy at the breast and would not stay latched.  Sides of mouth puckered, lips tucked in, LC worked to flange lips but infant would come unlatched.  LC had infant suck finger and suck rhythm was easily started.  An attempt was made on the alternate side and infant would not sustain latch and began sucking her fingers.    Bottle of EBM with slow flow nipple given to dad for infant feeding. Infant would not take the yellow slow flow nipple and tongue thrusted it out.  Parents previously used a MAM bottle/nipple and said she did very well with it.  Milk transferred to MAM bottle brought from home and infant was paced fed by dad and took 37 ml without any issues or difficulty sucking.   Remaining milk left in bottle and parents were told to feed within two hours.  LC provided mom with comfort gels.  Encouraged mom to continue to pump and provide breastmilk via bottle  After breastfeed or if infant is unable to latch; bottle feed.   All questions answered.  LC swaddled infant with dbl phot lights and eye protection placed before placing infant on back in bassinet.    LC strongly encouraged an OP LC consult for further evaluation with infant latch/suck at the breast.  Mom has follow up with  pediatrician 10/2. Maternal Data    Feeding Feeding Type: Breast Fed  LATCH Score Latch: Repeated attempts needed to sustain latch, nipple held in mouth throughout feeding, stimulation needed to elicit sucking reflex.  Audible Swallowing: A few with stimulation  Type of Nipple: Flat  Comfort (Breast/Nipple): Filling, red/small blisters or bruises, mild/mod discomfort  Hold (Positioning): Assistance needed to correctly position infant at breast and maintain latch.  LATCH Score: 5  Interventions Interventions: Breast feeding basics reviewed;Assisted with latch;Skin to skin;DEBP;Support pillows;Position options;Expressed milk;Hand express;Breast compression;Adjust position  Lactation Tools Discussed/Used Tools: Nipple Shields Pump Review: Setup, frequency, and cleaning   Consult Status Consult Status: Follow-up Date: 07/07/19 Follow-up type: In-patient    [facility] 07/06/2019, 11:16 PM

## 2019-07-07 ENCOUNTER — Ambulatory Visit: Payer: Self-pay

## 2019-07-07 NOTE — Lactation Note (Signed)
This note was copied from a baby's chart. Lactation Consultation Note  Patient Name: Kelly Cortez [id number] Date: 07/07/2019   Baby 76 hours old and mother has chosen to pump and bottle feeding. She is pumping approx 90 ml of breastmilk per session according to mother. She has DEBP at home and knows how to convert hospital kit to her pump. Reviewed engorgement care and monitoring voids/stools. Mother did not want to attempt breastfeeding and denies questions or concerns.      Maternal Data    Feeding Feeding Type: Breast Fed  LATCH Score                   Interventions    Lactation Tools Discussed/Used     Consult Status      Kelly Cortez 07/07/2019, 9:39 AM

## 2019-07-31 ENCOUNTER — Encounter (HOSPITAL_BASED_OUTPATIENT_CLINIC_OR_DEPARTMENT_OTHER): Payer: Self-pay

## 2019-07-31 ENCOUNTER — Emergency Department (HOSPITAL_BASED_OUTPATIENT_CLINIC_OR_DEPARTMENT_OTHER)
Admission: EM | Admit: 2019-07-31 | Discharge: 2019-07-31 | Disposition: A | Discharge status: Home / Self Care | Payer: 59 | Attending: Emergency Medicine | Admitting: Emergency Medicine

## 2019-07-31 ENCOUNTER — Other Ambulatory Visit: Payer: Self-pay

## 2019-07-31 DIAGNOSIS — B9689 Other specified bacterial agents as the cause of diseases classified elsewhere: Secondary | ICD-10-CM | POA: Insufficient documentation

## 2019-07-31 DIAGNOSIS — R102 Pelvic and perineal pain: Secondary | ICD-10-CM | POA: Diagnosis not present

## 2019-07-31 DIAGNOSIS — O8612 Endometritis following delivery: Secondary | ICD-10-CM | POA: Insufficient documentation

## 2019-07-31 DIAGNOSIS — Z79899 Other long term (current) drug therapy: Secondary | ICD-10-CM | POA: Diagnosis not present

## 2019-07-31 DIAGNOSIS — O99893 Other specified diseases and conditions complicating puerperium: Secondary | ICD-10-CM | POA: Diagnosis present

## 2019-07-31 LAB — URINALYSIS, MICROSCOPIC (REFLEX)

## 2019-07-31 LAB — URINALYSIS, ROUTINE W REFLEX MICROSCOPIC
Bilirubin Urine: NEGATIVE
Glucose, UA: NEGATIVE mg/dL
Ketones, ur: NEGATIVE mg/dL
Leukocytes,Ua: NEGATIVE
Nitrite: NEGATIVE
Protein, ur: NEGATIVE mg/dL
Specific Gravity, Urine: 1.02 (ref 1.005–1.030)
pH: 6 (ref 5.0–8.0)

## 2019-07-31 LAB — WET PREP, GENITAL
Clue Cells Wet Prep HPF POC: NONE SEEN
Sperm: NONE SEEN
Trich, Wet Prep: NONE SEEN
Yeast Wet Prep HPF POC: NONE SEEN

## 2019-07-31 MED ORDER — AMOXICILLIN-POT CLAVULANATE 875-125 MG PO TABS: 1.0000 | ORAL_TABLET | Freq: Two times a day (BID) | ORAL | 0 refills | Status: AC

## 2019-07-31 MED ORDER — AMOXICILLIN-POT CLAVULANATE 875-125 MG PO TABS
1.0000 | ORAL_TABLET | Freq: Once | ORAL | Status: AC
  Administered 2019-07-31: 03:00:00 1 via ORAL
  Filled 2019-07-31: qty 1

## 2019-07-31 MED ORDER — HYDROCODONE-ACETAMINOPHEN 5-325 MG PO TABS: 1.0000 | ORAL_TABLET | Freq: Four times a day (QID) | ORAL | 0 refills | Status: AC | PRN

## 2019-07-31 NOTE — ED Triage Notes (Signed)
Pt gave birth on 9/28. Today pt started having pain to the R side of her pelvis. Pt states she is having the "normal" postpartum discharge. Vaginal bleeding described as intermittent spotting.

## 2019-07-31 NOTE — ED Provider Notes (Signed)
[facility] Provider Note: Lowella Dell, MD, FACEP  CSN: [id number] MRN: [id number] ARRIVAL: 07/31/19 at 0010 ROOM: MH05/MH05   CHIEF COMPLAINT  Pelvic Pain   HISTORY OF PRESENT ILLNESS  07/31/19 1:23 AM Kelly Cortez is a 23 y.o. female who delivered vaginally on 07/04/2019.  She is now having sharp pain in her right pelvis since yesterday.  She rates the pain as a 7 out of 10, worse with movement or palpation.  She is having what she describes as normal lochia which is now intermittent spotting but not heavy bleeding.  She has not noticed any abnormal discharge.  She has not had sexual relations since giving birth.  She has not had a fever.   Past Medical History:  Diagnosis Date  . Medical history non-contributory     Past Surgical History:  Procedure Laterality Date  . NO PAST SURGERIES      No family history on file.  Social History   Tobacco Use  . Smoking status: Never Smoker  . Smokeless tobacco: Never Used  Substance Use Topics  . Alcohol use: Not Currently  . Drug use: Never    Prior to Admission medications   Medication Sig Start Date End Date Taking? Authorizing Provider  acetaminophen (TYLENOL) 325 MG tablet Take 2 tablets (650 mg total) by mouth every 6 (six) hours as needed (for pain scale < 4). 07/05/19   Fair, Hoyle Sauer, MD  AMBULATORY NON FORMULARY MEDICATION 1 Device by Other route once a week. Blood pressure cuff Monitored regularly at home  ICD Z34.90 03/28/19   Levie Heritage, DO  EVENING PRIMROSE OIL PO Take by mouth.    [provider]  ibuprofen (ADVIL) 600 MG tablet Take 1 tablet (600 mg total) by mouth every 8 (eight) hours as needed for mild pain. 07/05/19   Fair, Hoyle Sauer, MD  polyethylene glycol (MIRALAX / GLYCOLAX) 17 g packet Take 17 g by mouth daily. 07/05/19   Fair, Hoyle Sauer, MD  Prenatal MV-Min-Fe Fum-FA-DHA (VITAFOL-OB+DHA) 65-1 & 250 MG MISC Take 1 capsule by mouth daily. 03/31/19   Aviva Signs, CNM   senna-docusate (SENOKOT-S) 8.6-50 MG tablet Take 2 tablets by mouth daily. 07/06/19   FairHoyle Sauer, MD  witch hazel-glycerin (TUCKS) pad Apply 1 application topically as needed for hemorrhoids. 07/05/19   FairHoyle Sauer, MD    Allergies Patient has no known allergies.   REVIEW OF SYSTEMS  Negative except as noted here or in the History of Present Illness.   PHYSICAL EXAMINATION  Initial Vital Signs Blood pressure 116/71, pulse 90, temperature 98.6 F (37 C), temperature source Oral, resp. rate 20, height 5\' 2"  (1.575 m), weight 69.9 kg, SpO2 100 %, unknown if currently breastfeeding.  Examination General: Well-developed, well-nourished female in no acute distress; appearance consistent with age of record HENT: normocephalic; atraumatic Eyes: pupils equal, round and reactive to light; extraocular muscles intact Neck: supple Heart: regular rate and rhythm Lungs: clear to auscultation bilaterally Abdomen: soft; nondistended; right lower quadrant tenderness; bowel sounds present GU: Normal external genitalia; mucopurulent vaginal discharge with mucoid discharge per cervical os; no vaginal bleeding; cervical motion tenderness; right adnexal tenderness Extremities: No deformity; full range of motion; pulses normal Neurologic: Awake, alert and oriented; motor function intact in all extremities and symmetric; no facial droop Skin: Warm and dry Psychiatric: Normal mood and affect   RESULTS  Summary of this visit's results, reviewed and interpreted by myself:   EKG Interpretation  Date/Time:  Ventricular Rate:    PR Interval:    QRS Duration:   QT Interval:    QTC Calculation:   R Axis:     Text Interpretation:        Laboratory Studies: Results for orders placed or performed during the hospital encounter of 07/31/19 (from the past 24 hour(s))  Wet prep, genital     Status: Abnormal   Collection Time: 07/31/19  1:41 AM   Specimen: Vaginal  Result Value Ref Range    Yeast Wet Prep HPF POC NONE SEEN NONE SEEN   Trich, Wet Prep NONE SEEN NONE SEEN   Clue Cells Wet Prep HPF POC NONE SEEN NONE SEEN   WBC, Wet Prep HPF POC MANY (A) NONE SEEN   Sperm NONE SEEN   Urinalysis, Routine w reflex microscopic     Status: Abnormal   Collection Time: 07/31/19  2:00 AM  Result Value Ref Range   Color, Urine YELLOW YELLOW   APPearance CLEAR CLEAR   Specific Gravity, Urine 1.020 1.005 - 1.030   pH 6.0 5.0 - 8.0   Glucose, UA NEGATIVE NEGATIVE mg/dL   Hgb urine dipstick TRACE (A) NEGATIVE   Bilirubin Urine NEGATIVE NEGATIVE   Ketones, ur NEGATIVE NEGATIVE mg/dL   Protein, ur NEGATIVE NEGATIVE mg/dL   Nitrite NEGATIVE NEGATIVE   Leukocytes,Ua NEGATIVE NEGATIVE  Urinalysis, Microscopic (reflex)     Status: Abnormal   Collection Time: 07/31/19  2:00 AM  Result Value Ref Range   RBC / HPF 0-5 0 - 5 RBC/hpf   WBC, UA 0-5 0 - 5 WBC/hpf   Bacteria, UA FEW (A) NONE SEEN   Squamous Epithelial / LPF 0-5 0 - 5   Imaging Studies: No results found.  ED COURSE and MDM  Nursing notes, initial and subsequent vitals signs, including pulse oximetry, reviewed and interpreted by myself.  Vitals:   07/31/19 0020 07/31/19 0023  BP: 116/71   Pulse: 90   Resp: 20   Temp: 98.6 F (37 C)   TempSrc: Oral   SpO2: 100%   Weight:  69.9 kg  Height:  5\' 2"  (1.575 m)   Medications  amoxicillin-clavulanate (AUGMENTIN) 875-125 MG per tablet 1 tablet (has no administration in time range)    Examination and history concerning for postpartum endometritis.  Will start on Augmentin twice daily and she will follow-up with her OB/GYN tomorrow.  PROCEDURES  Procedures   ED DIAGNOSES     ICD-10-CM   1. Endometritis following delivery  O86.12        Adar Rase, Jenny Reichmann, MD 07/31/19 [phone]

## 2019-08-02 LAB — CULTURE, BETA STREP (GROUP B ONLY)

## 2019-08-02 LAB — GC/CHLAMYDIA PROBE AMP (~~LOC~~) NOT AT ARMC
Chlamydia: NEGATIVE
Neisseria Gonorrhea: NEGATIVE

## 2019-08-03 ENCOUNTER — Telehealth: Payer: Self-pay | Admitting: *Deleted

## 2019-08-03 NOTE — Telephone Encounter (Signed)
Post ED Visit - Positive Culture Follow-up  Culture report reviewed by antimicrobial stewardship pharmacist: [place] Team []  Elenor Quinones, Pharm.D. []  Heide Guile, Pharm.D., BCPS AQ-ID []  Parks Neptune, Pharm.D., BCPS []  Alycia Rossetti, Pharm.D., BCPS []  Joes, Pharm.D., BCPS, AAHIVP []  Legrand Como, Pharm.D., BCPS, AAHIVP [x]  Salome Arnt, PharmD, BCPS []  Johnnette Gourd, PharmD, BCPS []  Hughes Better, PharmD, BCPS []  Leeroy Cha, PharmD []  Laqueta Linden, PharmD, BCPS []  Albertina Parr, PharmD  [place] Team []  Leodis Sias, PharmD []  Lindell Spar, PharmD []  Royetta Asal, PharmD []  Graylin Shiver, Rph []  Rema Fendt) Glennon Mac, PharmD []  Arlyn Dunning, PharmD []  Netta Cedars, PharmD []  Dia Sitter, PharmD []  Leone Haven, PharmD []  Gretta Arab, PharmD []  Theodis Shove, PharmD []  Peggyann Juba, PharmD []  Reuel Boom, PharmD   Positive strep culture Treated with Amoxicillin, organism sensitive to the same and no further patient follow-up is required at this time.  Harlon Flor [facility] 08/03/2019, 11:44 AM

## 2019-08-09 ENCOUNTER — Ambulatory Visit: Payer: 59 | Admitting: Advanced Practice Midwife

## 2019-08-18 ENCOUNTER — Ambulatory Visit (INDEPENDENT_AMBULATORY_CARE_PROVIDER_SITE_OTHER): Payer: 59 | Admitting: Family Medicine

## 2019-08-18 ENCOUNTER — Encounter: Payer: Self-pay | Admitting: Family Medicine

## 2019-08-18 ENCOUNTER — Other Ambulatory Visit: Payer: Self-pay

## 2019-08-18 DIAGNOSIS — Z1389 Encounter for screening for other disorder: Secondary | ICD-10-CM

## 2019-08-18 NOTE — Progress Notes (Signed)
Post Partum Exam  Kelly Cortez is a 23 y.o. [id number] female who presents for a postpartum visit. She is 6 weeks postpartum following a spontaneous vaginal delivery. I have fully reviewed the prenatal and intrapartum course. The delivery was at 40.5 gestational weeks.  Anesthesia: none. Postpartum course has been complicated by pelvic infection. Baby's course has been uneventful. Baby is feeding by bottle . Bleeding thin lochia. Bowel function is normal. Bladder function is normal. Patient is not sexually active. Contraception method is condoms. Postpartum depression screening:neg  Last pap smear done March 2020  and was Normal  (per patient)  Review of Systems Pertinent items are noted in HPI.    Objective:  unknown if currently breastfeeding.  General:  alert and cooperative  Lungs: clear to auscultation bilaterally  Heart:  regular rate and rhythm, S1, S2 normal, no murmur, click, rub or gallop  Abdomen: soft, non-tender; bowel sounds normal; no masses,  no organomegaly        Assessment:    Normal postpartum exam. Pap smear not done at today's visit.   Plan:   1. Contraception: condoms 2. Follow up in: 1 year or as needed.

## 2019-09-01 ENCOUNTER — Emergency Department (HOSPITAL_BASED_OUTPATIENT_CLINIC_OR_DEPARTMENT_OTHER)
Admission: EM | Admit: 2019-09-01 | Discharge: 2019-09-01 | Disposition: A | Discharge status: Home / Self Care | Payer: 59 | Attending: Emergency Medicine | Admitting: Emergency Medicine

## 2019-09-01 ENCOUNTER — Other Ambulatory Visit: Payer: Self-pay

## 2019-09-01 ENCOUNTER — Encounter (HOSPITAL_BASED_OUTPATIENT_CLINIC_OR_DEPARTMENT_OTHER): Payer: Self-pay | Admitting: Emergency Medicine

## 2019-09-01 DIAGNOSIS — M791 Myalgia, unspecified site: Secondary | ICD-10-CM | POA: Diagnosis not present

## 2019-09-01 DIAGNOSIS — R067 Sneezing: Secondary | ICD-10-CM | POA: Diagnosis not present

## 2019-09-01 DIAGNOSIS — Z79899 Other long term (current) drug therapy: Secondary | ICD-10-CM | POA: Insufficient documentation

## 2019-09-01 DIAGNOSIS — Z20822 Contact with and (suspected) exposure to covid-19: Secondary | ICD-10-CM

## 2019-09-01 DIAGNOSIS — R509 Fever, unspecified: Secondary | ICD-10-CM | POA: Diagnosis present

## 2019-09-01 DIAGNOSIS — Z20828 Contact with and (suspected) exposure to other viral communicable diseases: Secondary | ICD-10-CM | POA: Insufficient documentation

## 2019-09-01 LAB — SARS CORONAVIRUS 2 (TAT 6-24 HRS): SARS Coronavirus 2: NEGATIVE

## 2019-09-01 NOTE — ED Provider Notes (Signed)
[place] [facility]PT [facility] Provider Note: Georgena Spurling, MD, FACEP  CSN: [id number] MRN: [id number] ARRIVAL: 09/01/19 at [place]  Fever   HISTORY OF PRESENT ILLNESS  09/01/19 3:17 AM Kelly Cortez is a 23 y.o. female who was recently exposed to COVID-19.  She is here with fever that began yesterday morning about 5 AM.  Her fever has been as high as 101.2.  She has had associated muscle aches, coughing and sneezing.  She denies shortness of breath, change in taste or smell, nausea, vomiting or diarrhea.  She has not been taking anything for her symptoms.  She is concerned she may have Covid.   Past Medical History:  Diagnosis Date  . Medical history non-contributory     Past Surgical History:  Procedure Laterality Date  . NO PAST SURGERIES      History reviewed. No pertinent family history.  Social History   Tobacco Use  . Smoking status: Never Smoker  . Smokeless tobacco: Never Used  Substance Use Topics  . Alcohol use: Not Currently  . Drug use: Never    Prior to Admission medications   Medication Sig Start Date End Date Taking? Authorizing Provider  acetaminophen (TYLENOL) 325 MG tablet Take 2 tablets (650 mg total) by mouth every 6 (six) hours as needed (for pain scale < 4). 07/05/19   Fair, Marin Shutter, MD  AMBULATORY NON FORMULARY MEDICATION 1 Device by Other route once a week. Blood pressure cuff Monitored regularly at home  ICD Z34.90 03/28/19   Truett Mainland, DO  amoxicillin-clavulanate (AUGMENTIN) 875-125 MG tablet Take 1 tablet by mouth 2 (two) times daily. 07/31/19   Creedence Kunesh, Jenny Reichmann, MD  EVENING PRIMROSE OIL PO Take by mouth.    [provider]  HYDROcodone-acetaminophen (NORCO) 5-325 MG tablet Take 1 tablet by mouth every 6 (six) hours as needed (for pain). 07/31/19   Josedejesus Marcum, MD  ibuprofen (ADVIL) 600 MG tablet Take 1 tablet (600 mg total) by mouth every 8 (eight) hours as needed for mild pain. 07/05/19   Fair,  Marin Shutter, MD  polyethylene glycol (MIRALAX / GLYCOLAX) 17 g packet Take 17 g by mouth daily. 07/05/19   Fair, Marin Shutter, MD  Prenatal MV-Min-Fe Fum-FA-DHA (VITAFOL-OB+DHA) 65-1 & 250 MG MISC Take 1 capsule by mouth daily. 03/31/19   Seabron Spates, CNM  senna-docusate (SENOKOT-S) 8.6-50 MG tablet Take 2 tablets by mouth daily. 07/06/19   FairMarin Shutter, MD  witch hazel-glycerin (TUCKS) pad Apply 1 application topically as needed for hemorrhoids. 07/05/19   FairMarin Shutter, MD    Allergies Patient has no known allergies.   REVIEW OF SYSTEMS  Negative except as noted here or in the History of Present Illness.   PHYSICAL EXAMINATION  Initial Vital Signs Blood pressure 118/84, pulse 88, temperature 98.5 F (36.9 C), temperature source Oral, resp. rate 18, height 5\' 2"  (1.575 m), weight 70.8 kg, SpO2 99 %, not currently breastfeeding.  Examination General: Well-developed, well-nourished female in no acute distress; appearance consistent with age of record HENT: normocephalic; atraumatic Eyes: pupils equal, round and reactive to light; extraocular muscles intact Neck: supple Heart: regular rate and rhythm Lungs: clear to auscultation bilaterally Abdomen: soft; nondistended; nontender; bowel sounds present Extremities: No deformity; full range of motion; pulses normal Neurologic: Awake, alert and oriented; motor function intact in all extremities and symmetric; no facial droop Skin: Warm and dry Psychiatric: Normal mood and affect   RESULTS  Summary of this  visit's results, reviewed and interpreted by myself:   EKG Interpretation  Date/Time:    Ventricular Rate:    PR Interval:    QRS Duration:   QT Interval:    QTC Calculation:   R Axis:     Text Interpretation:        Laboratory Studies: No results found for this or any previous visit (from the past 24 hour(s)). Imaging Studies: No results found.  ED COURSE and MDM  Nursing notes, initial and subsequent vitals  signs, including pulse oximetry, reviewed and interpreted by myself.  Vitals:   09/01/19 0201  BP: 118/84  Pulse: 88  Resp: 18  Temp: 98.5 F (36.9 C)  TempSrc: Oral  SpO2: 99%  Weight: 70.8 kg  Height: 5\' 2"  (1.575 m)   Medications - No data to display  Patient symptoms are mild at this time and her vital signs are normal.  We will test her for Covid and advised her to stay quarantined until test results return.  Kelly Cortez was evaluated in Emergency Department on 09/01/2019 for the symptoms described in the history of present illness. She was evaluated in the context of the global COVID-19 pandemic, which necessitated consideration that the patient might be at risk for infection with the SARS-CoV-2 virus that causes COVID-19. Institutional protocols and algorithms that pertain to the evaluation of patients at risk for COVID-19 are in a state of rapid change based on information released by regulatory bodies including the CDC and federal and state organizations. These policies and algorithms were followed during the patient's care in the ED.   PROCEDURES  Procedures   ED DIAGNOSES     ICD-10-CM   1. Suspected COVID-19 virus infection  Z20.828        Marinell Igarashi, 09/03/2019, MD 09/01/19 [phone]

## 2019-09-01 NOTE — ED Triage Notes (Signed)
Pt arrives with infant who is also a patient. 8 weeks postpartum, no hx, no allergies adult female presenting with fever TMAX 101.2 with muscle aches, coughing and sneezing. No tylenol or motrin today per pt, fever resolved temp in triage 98.5. Husband tested positive for COVID on Sunday.

## 2020-05-10 IMAGING — US US MFM OB FOLLOW UP
1 series · 14 of 28 positions shown · non-contrast
Comparison: none

[Series 1: us mfm ob follow up · 57 acquisitions, 14 frames shown]
[im 3/57]
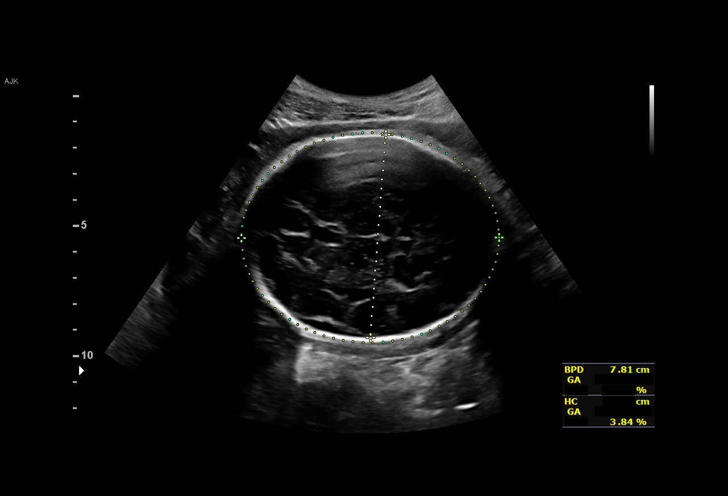
[im 7/57]
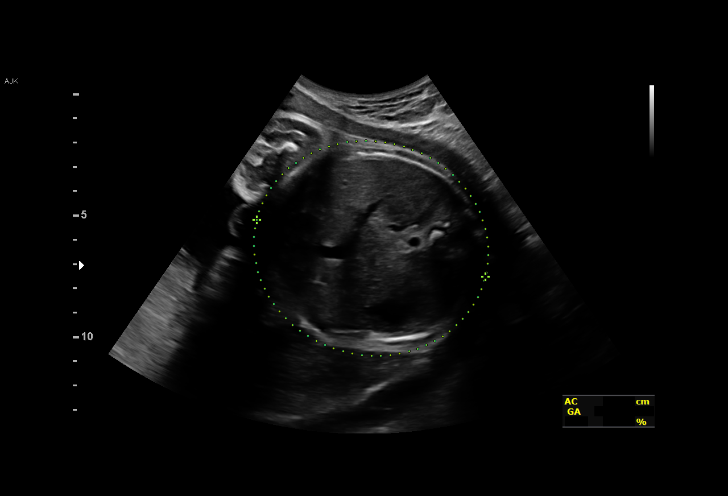
[im 11/57]
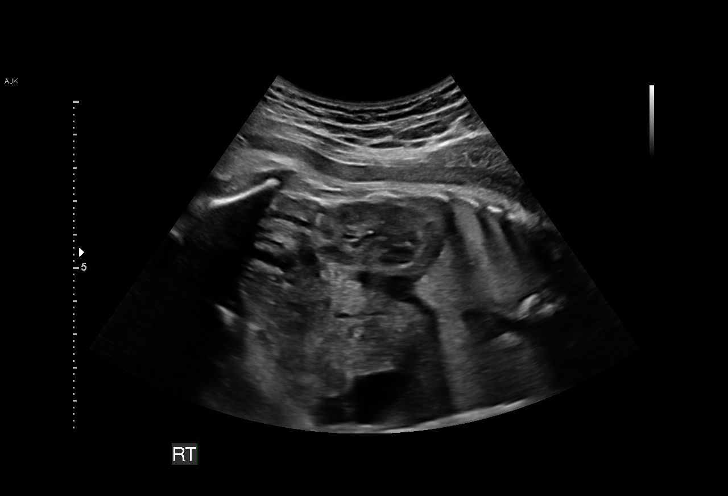
[im 15/57]
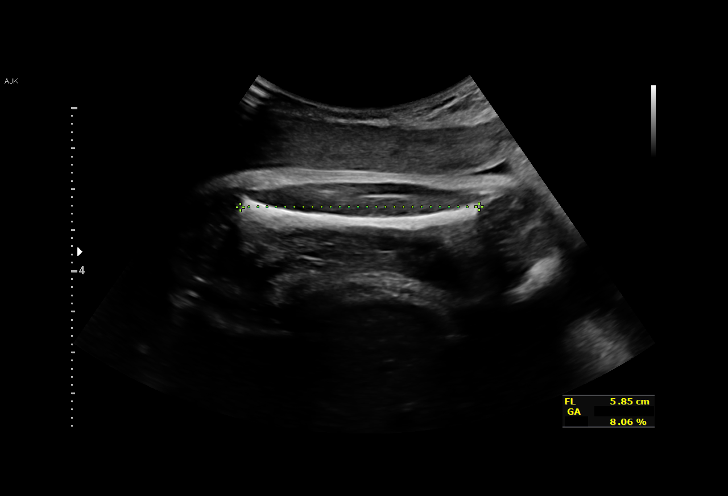
[im 19/57]
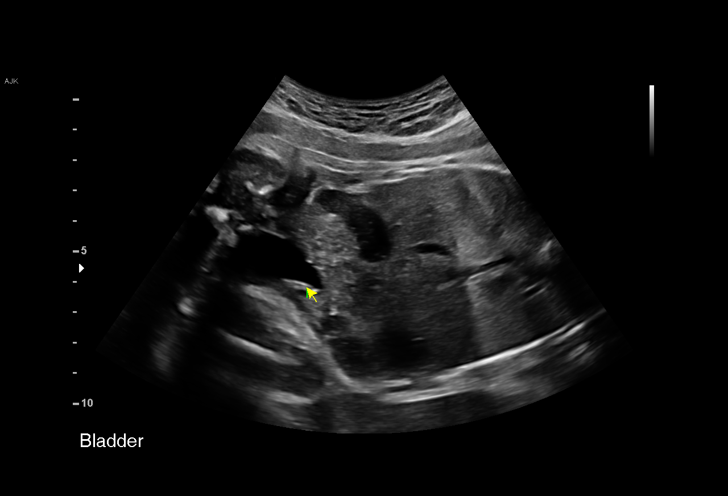
[im 23/57]
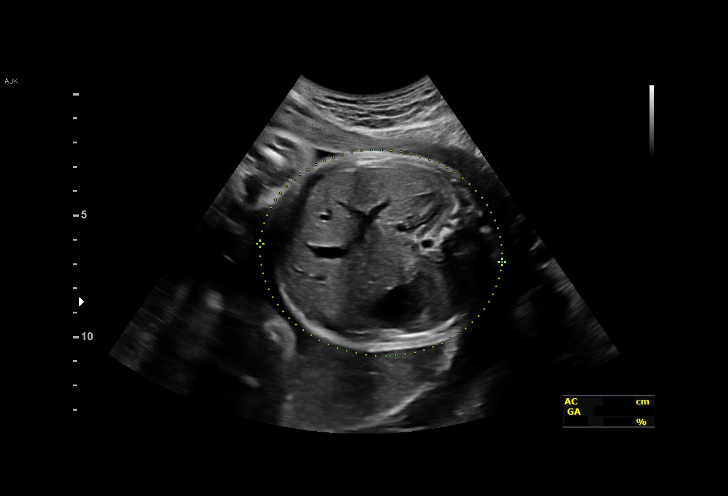
[im 27/57]
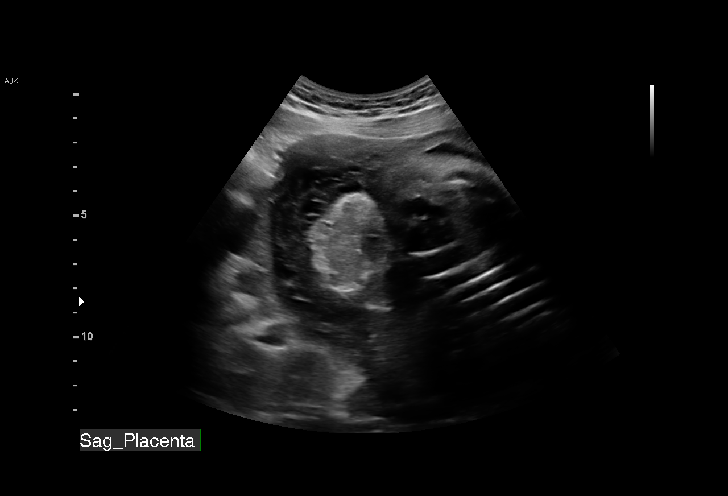
[im 32/57]
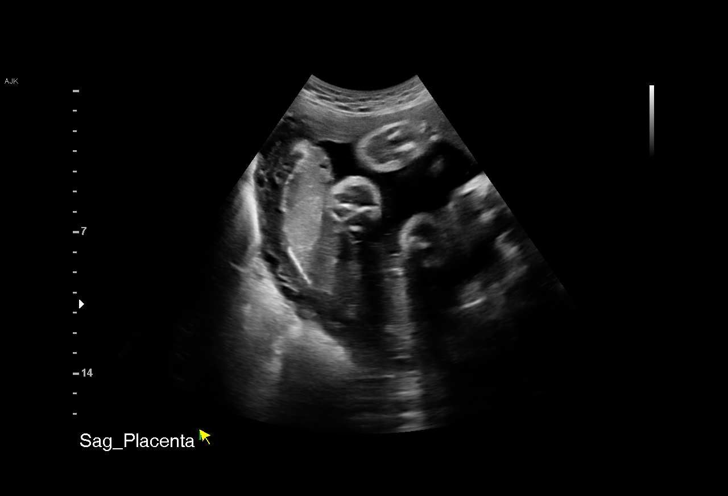
[im 36/57]
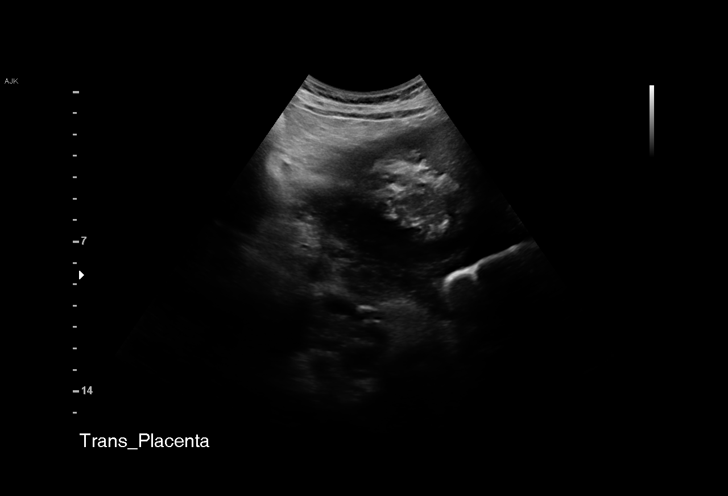
[im 40/57]
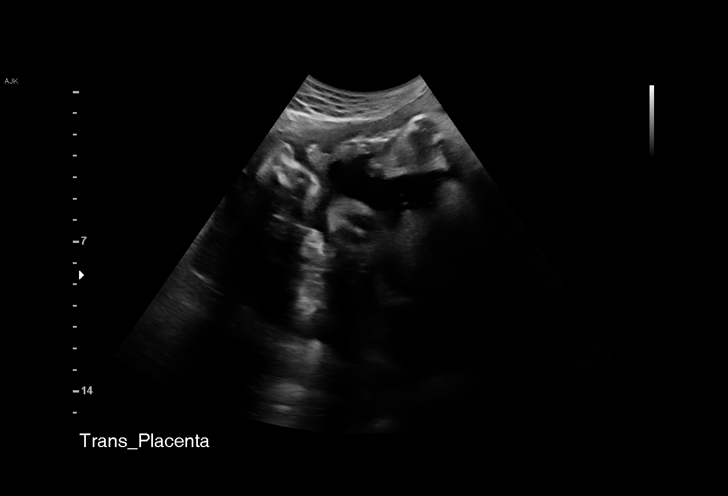
[im 44/57]
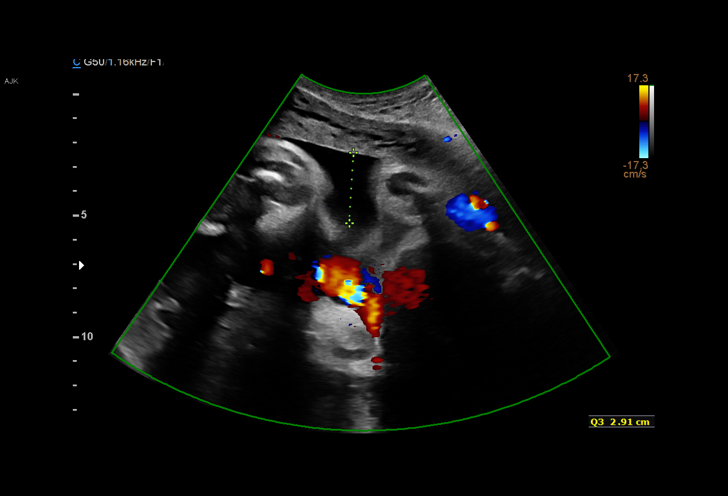
[im 48/57]
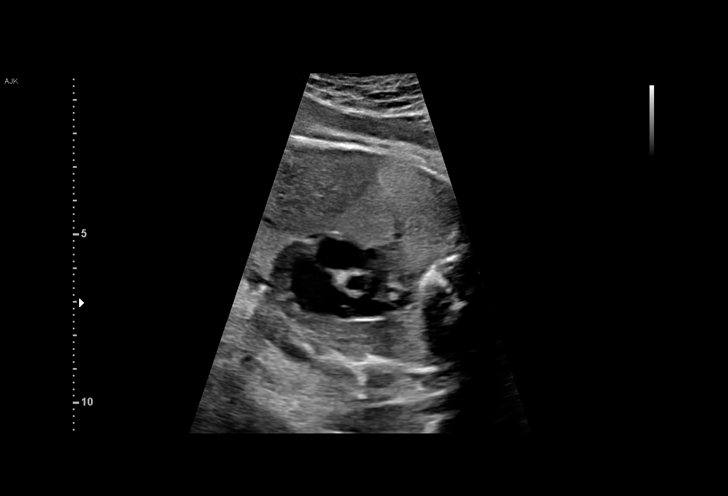
[im 52/57]
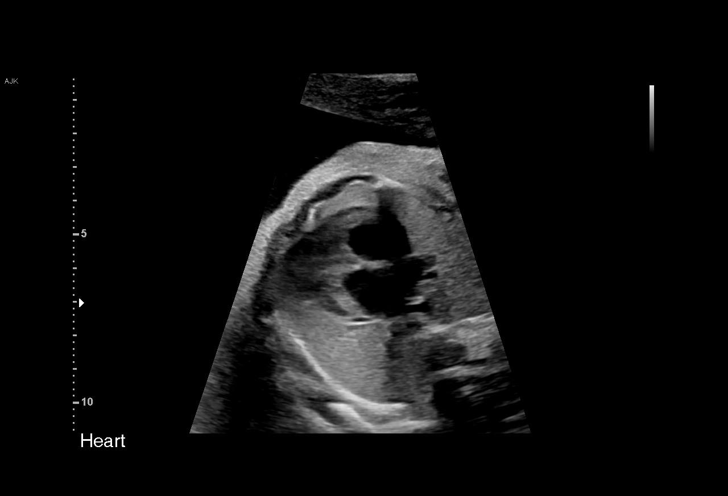
[im 57/57]
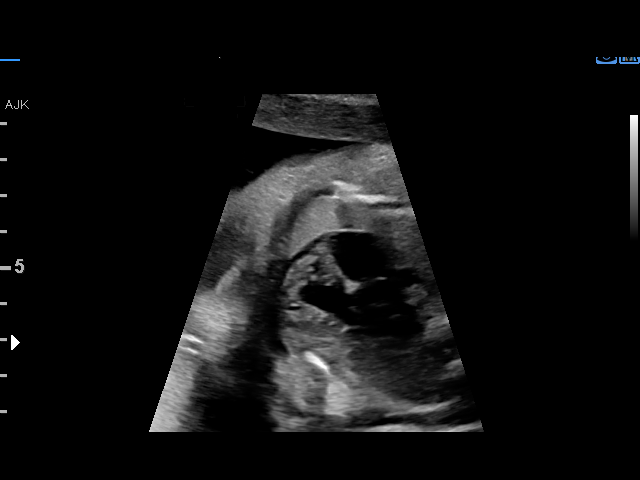

[14 of 28 positions shown; findings below may reference images not displayed]

NEYTHAN
 ----------------------------------------------------------------------

 ----------------------------------------------------------------------
Indications

  32 weeks gestation of pregnancy
  Encounter for other antenatal screening
  follow-up
 ----------------------------------------------------------------------
Fetal Evaluation

 Num Of Fetuses:          1
 Fetal Heart Rate(bpm):   144
 Cardiac Activity:        Observed
 Presentation:            Cephalic
 Placenta:                Posterior
 P. Cord Insertion:       Visualized, central

 Amniotic Fluid
 AFI FV:      Within normal limits

 AFI Sum(cm)     %Tile       Largest Pocket(cm)
 12.6            36

 RUQ(cm)       RLQ(cm)       LUQ(cm)        LLQ(cm)

Biometry

 BPD:      78.6  mm     G. Age:  31w 4d         27  %    CI:        75.94   %    70 - 86
                                                         FL/HC:       20.7  %    19.1 -
 HC:      285.9  mm     G. Age:  31w 3d          7  %    HC/AC:       0.97       0.96 -
 AC:      293.8  mm     G. Age:  33w 3d         85  %    FL/BPD:      75.4  %    71 - 87
 FL:       59.3  mm     G. Age:  30w 6d         13  %    FL/AC:       20.2  %    20 - 24
 HUM:      53.7  mm     G. Age:  31w 2d         35  %

 LV:        5.4  mm

 Est. FW:    4452   gm     4 lb 5 oz     48  %
OB History

 Gravidity:    1         Term:   0        Prem:   0        SAB:   0
 TOP:          0       Ectopic:  0        Living: 0
Gestational Age

 U/S Today:     31w 6d                                        EDD:   07/05/19
 Best:          32w 0d     Det. By:  U/S  (04/13/19)          EDD:   07/04/19
Anatomy

 Cranium:               Appears normal         LVOT:                   Appears normal
 Cavum:                 Appears normal         Aortic Arch:            Appears normal
 Ventricles:            Appears normal         Diaphragm:              Appears normal
 Choroid Plexus:        Previously seen        Stomach:                Appears normal, left
                                                                       sided
 Cerebellum:            Previously seen        Abdomen:                Appears normal
 Posterior Fossa:       Previously seen        Abdominal Wall:         Previously seen
 Nuchal Fold:           Not applicable (>20    Cord Vessels:           Previously seen
                        wks GA)
 Face:                  Orbits and profile     Kidneys:                Appear normal
                        previously seen
 Lips:                  Appears normal         Bladder:                Appears normal
 Thoracic:              Appears normal         Spine:                  Previously seen
 Heart:                 Appears normal         Upper Extremities:      Previously seen
                        (4CH, axis, and
                        situs)
 RVOT:                  Appears normal         Lower Extremities:      Previously seen

 Other:  Hands and feet visualized. 3VV and 3VT visualized.
Impression

 Normal interval growth.
Recommendations

 Follow up growth as clinically indicated
